# Patient Record
Sex: Female | Born: 1970 | Race: White | Hispanic: No | Marital: Married | Smoking: Never smoker
Health system: Southern US, Community
[De-identification: ages and names within clinical notes are randomized; demographics above are authoritative.]

## PROBLEM LIST (undated history)

## (undated) DIAGNOSIS — F419 Anxiety disorder, unspecified: Secondary | ICD-10-CM

## (undated) DIAGNOSIS — Z8742 Personal history of other diseases of the female genital tract: Secondary | ICD-10-CM

## (undated) DIAGNOSIS — N938 Other specified abnormal uterine and vaginal bleeding: Secondary | ICD-10-CM

## (undated) DIAGNOSIS — B372 Candidiasis of skin and nail: Secondary | ICD-10-CM

## (undated) DIAGNOSIS — F329 Major depressive disorder, single episode, unspecified: Secondary | ICD-10-CM

## (undated) DIAGNOSIS — Z87898 Personal history of other specified conditions: Secondary | ICD-10-CM

## (undated) DIAGNOSIS — D649 Anemia, unspecified: Secondary | ICD-10-CM

## (undated) DIAGNOSIS — Z8619 Personal history of other infectious and parasitic diseases: Secondary | ICD-10-CM

## (undated) DIAGNOSIS — F32A Depression, unspecified: Secondary | ICD-10-CM

## (undated) DIAGNOSIS — F53 Postpartum depression: Secondary | ICD-10-CM

## (undated) DIAGNOSIS — O99345 Other mental disorders complicating the puerperium: Secondary | ICD-10-CM

## (undated) HISTORY — DX: Candidiasis of skin and nail: B37.2

## (undated) HISTORY — DX: Personal history of other infectious and parasitic diseases: Z86.19

## (undated) HISTORY — DX: Depression, unspecified: F32.A

## (undated) HISTORY — PX: WISDOM TOOTH EXTRACTION: SHX21

## (undated) HISTORY — DX: Other specified abnormal uterine and vaginal bleeding: N93.8

## (undated) HISTORY — DX: Personal history of other diseases of the female genital tract: Z87.42

## (undated) HISTORY — DX: Major depressive disorder, single episode, unspecified: F32.9

## (undated) HISTORY — DX: Postpartum depression: F53.0

## (undated) HISTORY — DX: Other mental disorders complicating the puerperium: O99.345

## (undated) HISTORY — DX: Anemia, unspecified: D64.9

## (undated) HISTORY — PX: DILATION AND CURETTAGE OF UTERUS: SHX78

## (undated) HISTORY — DX: Anxiety disorder, unspecified: F41.9

## (undated) HISTORY — DX: Personal history of other specified conditions: Z87.898

---

## 2003-07-07 ENCOUNTER — Other Ambulatory Visit: Admission: RE | Admit: 2003-07-07 | Discharge: 2003-07-07 | Payer: Self-pay | Admitting: Obstetrics and Gynecology

## 2004-08-21 DIAGNOSIS — Z8742 Personal history of other diseases of the female genital tract: Secondary | ICD-10-CM

## 2004-08-21 HISTORY — DX: Personal history of other diseases of the female genital tract: Z87.42

## 2005-06-28 ENCOUNTER — Other Ambulatory Visit: Admission: RE | Admit: 2005-06-28 | Discharge: 2005-06-28 | Payer: Self-pay | Admitting: Obstetrics and Gynecology

## 2005-08-21 DIAGNOSIS — Z8742 Personal history of other diseases of the female genital tract: Secondary | ICD-10-CM

## 2005-08-21 DIAGNOSIS — D649 Anemia, unspecified: Secondary | ICD-10-CM

## 2005-08-21 DIAGNOSIS — B372 Candidiasis of skin and nail: Secondary | ICD-10-CM

## 2005-08-21 HISTORY — DX: Personal history of other diseases of the female genital tract: Z87.42

## 2005-08-21 HISTORY — DX: Anemia, unspecified: D64.9

## 2005-08-21 HISTORY — DX: Candidiasis of skin and nail: B37.2

## 2005-08-28 ENCOUNTER — Inpatient Hospital Stay (HOSPITAL_COMMUNITY): Admission: AD | Admit: 2005-08-28 | Discharge: 2005-08-28 | Payer: Self-pay | Admitting: Obstetrics and Gynecology

## 2006-04-20 ENCOUNTER — Inpatient Hospital Stay (HOSPITAL_COMMUNITY): Admission: AD | Admit: 2006-04-20 | Discharge: 2006-04-20 | Payer: Self-pay | Admitting: Obstetrics and Gynecology

## 2006-04-20 ENCOUNTER — Inpatient Hospital Stay (HOSPITAL_COMMUNITY): Admission: AD | Admit: 2006-04-20 | Discharge: 2006-04-23 | Payer: Self-pay | Admitting: Obstetrics and Gynecology

## 2006-04-21 ENCOUNTER — Encounter (INDEPENDENT_AMBULATORY_CARE_PROVIDER_SITE_OTHER): Payer: Self-pay | Admitting: *Deleted

## 2006-04-21 DIAGNOSIS — Z87898 Personal history of other specified conditions: Secondary | ICD-10-CM

## 2006-04-21 HISTORY — DX: Personal history of other specified conditions: Z87.898

## 2006-07-02 ENCOUNTER — Other Ambulatory Visit: Admission: RE | Admit: 2006-07-02 | Discharge: 2006-07-02 | Payer: Self-pay | Admitting: Obstetrics and Gynecology

## 2006-08-21 DIAGNOSIS — F419 Anxiety disorder, unspecified: Secondary | ICD-10-CM

## 2006-08-21 DIAGNOSIS — F53 Postpartum depression: Secondary | ICD-10-CM

## 2006-08-21 HISTORY — DX: Postpartum depression: F53.0

## 2006-08-21 HISTORY — DX: Anxiety disorder, unspecified: F41.9

## 2008-12-08 ENCOUNTER — Encounter: Admission: RE | Admit: 2008-12-08 | Discharge: 2008-12-08 | Payer: Self-pay | Admitting: Obstetrics and Gynecology

## 2009-08-21 HISTORY — PX: POLYPECTOMY: SHX149

## 2009-08-21 HISTORY — PX: HYSTEROSCOPY: SHX211

## 2010-06-30 ENCOUNTER — Ambulatory Visit (HOSPITAL_COMMUNITY): Admission: RE | Admit: 2010-06-30 | Discharge: 2010-06-30 | Payer: Self-pay | Admitting: Obstetrics and Gynecology

## 2010-11-01 LAB — CBC
HCT: 37.7 % (ref 36.0–46.0)
Hemoglobin: 12.5 g/dL (ref 12.0–15.0)
MCH: 28.7 pg (ref 26.0–34.0)
MCHC: 33.3 g/dL (ref 30.0–36.0)
MCV: 86.4 fL (ref 78.0–100.0)
Platelets: 311 10*3/uL (ref 150–400)
RBC: 4.36 MIL/uL (ref 3.87–5.11)
RDW: 12.3 % (ref 11.5–15.5)
WBC: 7.3 10*3/uL (ref 4.0–10.5)

## 2010-11-01 LAB — PREGNANCY, URINE: Preg Test, Ur: NEGATIVE

## 2011-01-06 NOTE — Discharge Summary (Signed)
NAMEARELENE, MORONI              ACCOUNT NO.:  [id number]   MEDICAL RECORD NO.:  [id number]          PATIENT TYPE:  INP   LOCATION:  9112                          F[facility]ILITY:  [facility]   PHYSICIAN:  Janine Limbo, M.D.DATE OF BIRTH:  07-Mar-1971   DATE OF ADMISSION:  04/20/2006  DATE OF DISCHARGE:  04/23/2006                                 DISCHARGE SUMMARY   ADMITTING DIAGNOSIS:  Intrauterine pregnancy at term, in labor.   PREOPERATIVE DIAGNOSES:  1. Intrauterine pregnancy at term.  2. Arrest of second stage of labor.  3. Maternal fatigue.   PROCEDURE:  1. Vacuum extractor vaginal delivery.  2. Repair of second-degree midline perineal laceration.   DISCHARGE DIAGNOSES:  1. Intrauterine pregnancy at term, delivered.  2. Arrest of second stage of labor.  3. Maternal fatigue.  4. Vacuum extractor.  5. Vaginal delivery.  6. Repair of second-degree midline perineal laceration.   HOSPITAL COURSE:  Ms. Klimas is a 40 year old gravida 1, para 0 who  presents at 39-5/7 weeks in early active labor.  Her labor progressed with  the use of Pitocin.  She became completely dilated and pushed well.  Dr.  Stefano Gaul was notified of arrest of second stage of labor and maternal  fatigue.  He came and discussed the options with the patient, and the  patient opted for vacuum extraction assistance.  This was accomplished on  April 21, 2006 by Dr. Marline Backbone.  Please see delivery note.  The  patient gave birth to a 7 pound, 4 ounce  female infant named Maisie Fus with  Apgar scores of 9 at one minute and 9 at five minutes.  Cord pH 7.27.  The  patient has done well in the postpartum period.  Her vital signs have  remained stable and she is afebrile.  Her hemoglobin on the first postpartum  day was 10.8, and on this, her second postpartum day, she is judged to be in  satisfactory condition for discharge.   DISCHARGE INSTRUCTIONS:  Per [facility] handout.   DISCHARGE MEDICATIONS:  1.  Motrin 600 mg p.o. q.6h. p.r.n. pain.  2. Tylox 1-2 p.o. q.3-4h. p.r.n. pain.  3. Prenatal vitamins.   DISCHARGE FOLLOW UP:  At [facility] in 6 weeks.      Rica Koyanagi, C.N.M.      Janine Limbo, M.D.  Electronically Signed    SDM/MEDQ  D:  04/23/2006  T:  04/23/2006  Job:  [id number]

## 2011-01-06 NOTE — H&P (Signed)
[name], [name]              ACCOUNT NO.:  [id number]   MEDICAL RECORD NO.:  [id number]          PATIENT TYPE:  INP   LOCATION:  9165                          [facility]ILITY:  [facility]   PHYSICIAN:  Crist Fat. Rivard, M.D. DATE OF BIRTH:  06/13/1971   DATE OF ADMISSION:  04/20/2006  DATE OF DISCHARGE:                                HISTORY & PHYSICAL   [name] is a 40 year old, gravida 1, para 2, who presents at 39-5/[redacted]  weeks gestation with complaints of regular uterine contractions with  increased intensity since 6 p.m., the day of admission.  The patient denies  rupture of membranes or bleeding.  The patient reports her fetus has been  moving normally.  The patient also complains of nausea and vomiting with her  uterine contractions and has been unable the keep down fluids and solids  today.  The patient reports increased intensity of contractions.  The  patient was previously evaluated by maternity admissions the day of  admission early a.m. and was discharged home after therapeutic rest.  The  patient received Ambien prior to discharge and did sleep throughout most of  the afternoon prior to the evening of the day of admission.  The patient  denies headaches, vision changes, right upper quadrant pain.  The patient  group B strep test is negative.  The patient states that she is having a  hard time dealing with contractions at the present time and requesting  relief from pain.  The patient's pregnancy remarkable for:  1. Increased body mass index.  2. Family history of muscular dystrophy.  3. History of depression.  4. History of migraine.  5. PENICILLIN allergy.   PRENATAL LABS:  Initial hemoglobin 12.4, hematocrit 35.4, platelets 256,000,  blood type O positive, antibody screen negative.  Toxoplasmosis IgG, IgM  negative x2.  RPR nonreactive.  Rubella titer equivocal.  Hepatitis B  surface antigen negative.  HIV nonreactive.  Pap smear within normal limits  November 2006.   Gonorrhea, Chlamydia cultures negative x2.  Cystic fibrosis  screening negative.  A 27-week hemoglobin 11.4.  One-hour Glucola 91.  RPR  at 27 weeks nonreactive.  Group B strep negative at 35 weeks.  Gonorrhea and  Chlamydia cultures declined at 35 weeks.   HISTORY OF PRESENT PREGNANCY:  The patient entered care at [redacted] weeks  gestation.  The patient's pregnancy has been followed by the certified nurse  midwife service at [facility].  The patient and her husband  declined genetic counseling secondary to family history of muscular  dystrophy.  The patient underwent free nuchal translucency screening  ultrasound at [redacted] weeks gestation with normal nuchal translucency at that  time.  The patient with nausea and vomiting in early pregnancy requiring  hospitalization for approximately 24 hours for IV hydration.  The patient  with use of Zofran in first and early second trimester for nausea and  vomiting.  Quadruple screen was declined.  The patient underwent ultrasound  at [redacted] weeks gestation which revealed size consistent with tight cervix, 3.88  cm, anterior placenta and normal anatomy except for bilateral fetal  renal  pyelectasis.  Ultrasound repeated at 22 weeks to follow up fetal renal  pyelectasis with fetal renal pyelectasis stable and normal amniotic fluid as  well as bladder appearing normally.  The patient underwent Glucola at 27  weeks.  The patient with repeat ultrasound done at 32 weeks for repeat  evaluation of fetal renal pyelectasis with fetal renal pyelectasis stable at  that point in time.  The remainder of the patient's pregnancy has been  unremarkable.  The patient's LMP was July 16, 2005 with [facility] of April 22, 2006 by dates, confirmed by ultrasound at six weeks and one day  gestation.   OB HISTORY:  Pregnancy #1 at present.   GYN HISTORY:  The patient reports regular menses every 28 to 30 days.  The  patient denies history of abnormal Pap smear or STDs.   The patient denies  any contraceptive use.   PAST MEDICAL HISTORY:  1. The patient with history of migraine headaches.  2. The patient with a history of depression in the past.  No medicine.      However, the patient has undergone counseling.  3. The patient with a history of occasional cystitis in the past.   FAMILY HISTORY:  Mother with COPD, borderline diabetes, hypothyroidism, and  depression.  Sister with migraines, depression.  Father with migraines.  Nicotine use.  Paternal grandmother with lung cancer.  Otherwise family  history negative.   GENETIC HISTORY:  The father of the baby's father with myotonia congenita.  The patient's second cousins with hemophilia.  Otherwise negative genetic  history.   CURRENT MEDICATIONS:  Prenatal vitamins.   ALLERGIES:  The patient is allergic to PENICILLIN and CEPHALOSPORINS.   SOCIAL HISTORY:  The patient is married to the father of the baby, Arlys John,  who is involved and supportive.  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  The father of the baby works as a [employer] fast streamer and has 12  years of education.  The patient denies use of alcohol, tobacco, and street  drugs.  The patient's domestic violence screen is negative.   PAST SURGICAL HISTORY:  Wisdom teeth at age 61.   REVIEW OF SYSTEMS:  Typical of __________  at term pregnancy.   PHYSICAL EXAMINATION:  VITAL SIGNS:  The patient is afebrile.  Vital signs  are stable.  HEENT:  Within normal limits.  LUNGS:  Clear.  HEART:  Regular rate and rhythm.  BREASTS:  Soft.  ABDOMEN:  Soft, gravid and nontender.  Fundal height extending 39 cm.  __________  symphysis pubis.  Fetus noted to be in longitudinal lie and  vertex to Leopold's maneuvers.  Fetal heart rate 140 at baseline with  variability as well as accelerations at present.  No decelerations are  present and fetal heart rate status is reassuring.  Uterine contractions are noted every five to seven minutes at  present and moderate to palpation.  CERVICAL:  Cervix to be 4-5 cm dilated, 90% effaced, minus 1 station with  bulging bag of water and small amount of bloody show noted.  EXTREMITIES:  With negative edema and negative Homan's bilaterally.   ASSESSMENT:  1. Intrauterine pregnancy at 39-5/7 weeks.  2. Early labor.   PLAN:  1. The patient will be admitted to birthing suite for consult with Dr.      Estanislado Pandy.  2. The patient plans epidural anesthesia during her labor of birth.  3. Routine CNM orders.  Will augment the Pitocin  p.r.n. to achieve      adequate labor pattern.      Rhona Leavens, CNM      Crist Fat Rivard, M.D.  Electronically Signed    NOS/MEDQ  D:  04/21/2006  T:  04/21/2006  Job:  [id number]

## 2011-01-06 NOTE — Op Note (Signed)
[name], [name] NO.:  [id number]   MEDICAL RECORD NO.:  [id number]          PATIENT TYPE:  INP   LOCATION:  9112                          [facility]ILITY:  [facility]   PHYSICIAN:  Janine Limbo, M.D.DATE OF BIRTH:  08-Aug-1971   DATE OF PROCEDURE:  04/21/2006  DATE OF DISCHARGE:                                 OPERATIVE REPORT   PREOPERATIVE DIAGNOSES:  1. Term intrauterine pregnancy.  2. Arrest at second stage of labor.  3. Maternal fatigue.   POSTOPERATIVE DIAGNOSES:  1. Term intrauterine pregnancy.  2. Arrest at second stage of labor.  3. Maternal fatigue.  4. Second-degree midline laceration.   PROCEDURE:  1. Vacuum extraction vaginal delivery.  2. Repair of second-degree midline laceration.   OBSTETRICIAN:  Leonard Schwartz, M.D.   FIRST ASSISTANT:  Philipp Deputy, C.N.M.   ANESTHETIC:  Epidural and 1% local Xylocaine.   DISPOSITION:  [name] is a 40 year old female, gravida 1, para 0, who  was admitted on April 20, 2006 at 39 weeks and 5 days' gestation.  She has  been followed at the [facility].  The patient was noted to be 4-5 cm on  admission.  The patient was followed through her labor by the nurse midwife  service.  She was started on Pitocin to augment her labor.  The patient was  noted to be completely dilated at approximately 6 o'clock a.m.  She was  allowed to push for some portion of the time, but then also allowed to rest  for some portion of her second stage of labor.  She did have variable  decelerations with pushing and she was placed in different positions to held  alleviate the decelerations.  She again began to push on a more consistent  basis, but became very tired from the pushing process.  The fetal head was  noted to be at a +3 station.  The cervix was completely effaced.  We  discussed our management options, which included continue pushing,  operative  vaginal delivery, and cesarean delivery.  The risk and benefits of those  options were reviewed.  The patient and the father the baby agreed to  proceed with operative vaginal delivery.  We discussed forceps vaginal  delivery and vacuum extraction vaginal delivery.  The risk and benefits of  those were reviewed.  The patient and the father the baby agreed to proceed  with vacuum extraction vaginal delivery.  The specific risks of vacuum  extraction vaginal delivery were then outlined including, but not limited  to, caput formation, hematoma formation, the rare risk of intracranial  bleeding, and the risk that the vacuum extraction would be unsuccessful and  that we would still need to proceed with cesarean delivery.  After all  questions were answered, they were ready to proceed.   FINDINGS:  The weight of the infant is currently not known.  The Apgar  scores for the baby were 9 at 1 minute and 9 at 5 minutes.  The arterial  cord blood pH was 7.27.  The infant was delivered from a slightly left  occiput anterior presentation.  There was noted to be a short umbilical  cord.  The umbilical cord did have 3 vessels.  The placental surfaces  appeared normal.  The placenta was sent to Pathology for evaluation.  The  patient was noted to have a second-degree midline laceration present after  the delivery.  There was a first-degree right labial laceration that was not  bleeding and did not require any stitches.  There were no upper vaginal or  cervical lacerations appreciated.  The postpartum uterus was noted to be  firm and bleeding was adequate.   PROCEDURE:  The patient was placed in a lithotomy position in the labor and  delivery suite.  The lower abdomen, perineum, and outer vagina were prepped  with soapy water solution.  The patient was then sterilely draped.  The  fetal head was noted to be a +3 station and you could see the fetal scalp  upon spreading the labia.  That Kiwi  vacuum extractor was applied.  The  infant was thought to be medium to small in size.  The head was thought to  be in an occiput-anterior presentation.  The patient was allowed to push.  Two pop-offs did occur.  We told the patient [employer] would suggest that  one should not apply the vacuum extractor after 2 pop-offs, but the patient  degrees to 1 additional try, because the fetal head was so close to being  delivered.  With 1 additional push, the patient was then able to deliver the  fetal head.  The mouth and nose were suctioned.  The remainder of the infant  was then delivered.  The cord was clamped and cut.  The infant was allowed  to rest on the mother's abdomen.  The pediatric team was present, but was  allowed to leave.  Routine cord blood studies were obtained.  The placenta  was removed.  The perineum was carefully inspected with findings as  mentioned above.  Ten milliliters of 1% Xylocaine were injected into the  midline.  The midline laceration was closed in a standard fashion using 3-0  Vicryl.  The upper vagina was carefully inspected and no lacerations were  present.  Hemostasis was adequate.  The uterus was noted to be firm.  The  infant was allowed to remain in the room with mother and father for bonding.  The placenta was sent to Pathology for evaluation.      Janine Limbo, M.D.  Electronically Signed     AVS/MEDQ  D:  04/21/2006  T:  04/23/2006  Job:  [id number]

## 2011-11-11 DIAGNOSIS — Z8742 Personal history of other diseases of the female genital tract: Secondary | ICD-10-CM

## 2011-11-11 HISTORY — DX: Personal history of other diseases of the female genital tract: Z87.42

## 2011-11-13 ENCOUNTER — Encounter (INDEPENDENT_AMBULATORY_CARE_PROVIDER_SITE_OTHER): Payer: BC Managed Care – PPO | Admitting: Obstetrics and Gynecology

## 2011-11-13 DIAGNOSIS — N921 Excessive and frequent menstruation with irregular cycle: Secondary | ICD-10-CM

## 2011-12-01 ENCOUNTER — Telehealth: Payer: Self-pay

## 2011-12-01 NOTE — Telephone Encounter (Signed)
Please pull chart.

## 2011-12-01 NOTE — Telephone Encounter (Signed)
Pt wants to sch novasure in office for a Thurs or Fri  ld

## 2011-12-04 ENCOUNTER — Encounter: Payer: Self-pay | Admitting: Internal Medicine

## 2011-12-04 ENCOUNTER — Ambulatory Visit (INDEPENDENT_AMBULATORY_CARE_PROVIDER_SITE_OTHER): Payer: Self-pay | Admitting: Internal Medicine

## 2011-12-04 VITALS — BP 112/66 | HR 59 | Temp 97.3°F | Ht 66.5 in | Wt 222.0 lb

## 2011-12-04 DIAGNOSIS — F32A Depression, unspecified: Secondary | ICD-10-CM | POA: Insufficient documentation

## 2011-12-04 DIAGNOSIS — G43909 Migraine, unspecified, not intractable, without status migrainosus: Secondary | ICD-10-CM | POA: Insufficient documentation

## 2011-12-04 DIAGNOSIS — R0683 Snoring: Secondary | ICD-10-CM | POA: Insufficient documentation

## 2011-12-04 DIAGNOSIS — F419 Anxiety disorder, unspecified: Secondary | ICD-10-CM | POA: Insufficient documentation

## 2011-12-04 DIAGNOSIS — R55 Syncope and collapse: Secondary | ICD-10-CM

## 2011-12-04 DIAGNOSIS — Z139 Encounter for screening, unspecified: Secondary | ICD-10-CM

## 2011-12-04 DIAGNOSIS — R0609 Other forms of dyspnea: Secondary | ICD-10-CM

## 2011-12-04 DIAGNOSIS — F329 Major depressive disorder, single episode, unspecified: Secondary | ICD-10-CM

## 2011-12-04 DIAGNOSIS — R5383 Other fatigue: Secondary | ICD-10-CM

## 2011-12-04 DIAGNOSIS — R5381 Other malaise: Secondary | ICD-10-CM

## 2011-12-04 LAB — POCT URINALYSIS DIPSTICK
Blood, UA: NEGATIVE
Glucose, UA: NEGATIVE
Ketones, UA: NEGATIVE
Leukocytes, UA: NEGATIVE
Nitrite, UA: NEGATIVE
Spec Grav, UA: 1.01
Urobilinogen, UA: 0.2
pH, UA: 7.5

## 2011-12-04 LAB — CBC WITH DIFFERENTIAL/PLATELET
Basophils Absolute: 0 10*3/uL (ref 0.0–0.1)
Basophils Relative: 0 % (ref 0–1)
Eosinophils Absolute: 0.1 10*3/uL (ref 0.0–0.7)
Eosinophils Relative: 2 % (ref 0–5)
HCT: 39.3 % (ref 36.0–46.0)
Hemoglobin: 12.4 g/dL (ref 12.0–15.0)
Lymphocytes Relative: 40 % (ref 12–46)
Lymphs Abs: 2.1 10*3/uL (ref 0.7–4.0)
MCH: 27.4 pg (ref 26.0–34.0)
MCHC: 31.6 g/dL (ref 30.0–36.0)
MCV: 86.8 fL (ref 78.0–100.0)
Monocytes Absolute: 0.3 10*3/uL (ref 0.1–1.0)
Monocytes Relative: 7 % (ref 3–12)
Neutro Abs: 2.6 10*3/uL (ref 1.7–7.7)
Neutrophils Relative %: 51 % (ref 43–77)
Platelets: 309 10*3/uL (ref 150–400)
RBC: 4.53 MIL/uL (ref 3.87–5.11)
RDW: 12.8 % (ref 11.5–15.5)
WBC: 5.1 10*3/uL (ref 4.0–10.5)

## 2011-12-04 LAB — COMPREHENSIVE METABOLIC PANEL
ALT: 15 U/L (ref 0–35)
AST: 20 U/L (ref 0–37)
Albumin: 4.3 g/dL (ref 3.5–5.2)
Alkaline Phosphatase: 57 U/L (ref 39–117)
BUN: 12 mg/dL (ref 6–23)
CO2: 26 mEq/L (ref 19–32)
Calcium: 9.2 mg/dL (ref 8.4–10.5)
Chloride: 106 mEq/L (ref 96–112)
Creat: 0.69 mg/dL (ref 0.50–1.10)
Glucose, Bld: 86 mg/dL (ref 70–99)
Potassium: 4.4 mEq/L (ref 3.5–5.3)
Sodium: 140 mEq/L (ref 135–145)
Total Bilirubin: 0.3 mg/dL (ref 0.3–1.2)
Total Protein: 6.8 g/dL (ref 6.0–8.3)

## 2011-12-04 LAB — T3, FREE: T3, Free: 2.9 pg/mL (ref 2.3–4.2)

## 2011-12-04 LAB — T4, FREE: Free T4: 1.22 ng/dL (ref 0.80–1.80)

## 2011-12-04 LAB — LIPID PANEL
Cholesterol: 172 mg/dL (ref 0–200)
HDL: 58 mg/dL (ref >39)
LDL Cholesterol: 100 mg/dL — ABNORMAL HIGH (ref 0–99)
Total CHOL/HDL Ratio: 3 Ratio
Triglycerides: 72 mg/dL (ref <150)
VLDL: 14 mg/dL (ref 0–40)

## 2011-12-04 LAB — TSH: TSH: 1.279 u[IU]/mL (ref 0.350–4.500)

## 2011-12-04 NOTE — Telephone Encounter (Signed)
Chart in SR office

## 2011-12-04 NOTE — Patient Instructions (Signed)
Labs will be mailed to you  Keep well hydrated especially in summer.    Will refer to Dr. Vickey Huger for sleep study  Schedule Complete physical with me

## 2011-12-04 NOTE — Progress Notes (Signed)
Here to establish care today. Reports had a vaso-vagal response at gynecologists office about 3 weeks ago.

## 2011-12-04 NOTE — Progress Notes (Signed)
  Subjective:    Patient ID: [name], female    DOB: 02-27-1971, 41 y.o.   MRN: [id number]  HPI  New patient here for first visit.  Former primary care at [facility] Warrick Parisian.  Past medical history of anxiety and depression, migraine headache, relieved by Excedrin Migraine. She has a therapist [employer].   Patient says her migraines are well controlled with Excedrin. She is concerned today because of several issues. She feels that she her thyroid may be under active. She has dry skin, she notices that her hair is falling out and it has been difficult to lose weight. She reports her thyroid blood work was tested several years ago.  She also tells me that her husband says that she has been snoring a lot. She also says that her husband reports that she wakes up and was gasping for breath while sleeping.  She is wondering if she has sleep apnea.  She also reports 3 weeks ago she was at her OB/GYN office and Dr. Estanislado Pandy and had a near syncopal episode. She felt dizzy like she was going to lose consciousness had to lie down, but she never lost complete consciousness. She denies palpitations chest pain or shortness of breath prior to episode. She she describes similar episodes on rare occasion she had one episode where she did lose consciousness she reports around 2000 or 2001. She also describes a near syncopal episode when she was about to get a shot of Novocain.  Review of Systems    See HPI Objective:   Physical Exam Physical Exam  Nursing note and vitals reviewed. Normal orthostatic vital signs my exam Constitutional: She is oriented to person, place, and time. She appears well-developed and well-nourished.  HENT:  Head: Normocephalic and atraumatic.  Cardiovascular: Normal rate and regular rhythm. Exam reveals no gallop and no friction rub.  No murmur heard.  Pulmonary/Chest: Breath sounds normal. She has no wheezes. She has no rales.  Neurological: She  is alert and oriented to person, place, and time.  Skin: Skin is warm and dry.  Psychiatric: She has a normal mood and affect. Her behavior is normal.        Assessment & Plan:  1)near syncope.  EKG today reveals sinus bradycardia at a rate of 57. Will go ahead and check baseline labs with thyroid studies. I advised her to drink lots of fluids and counseled her that if she has any complete loss of consciousness or increased frequency of symptoms she is to return to office. If problem becomes more frequent she may need Holter examination.  2) depression and anxiety. Depression dates back to her postpartum period and seems to be well controlled she has a therapist that she has a good relationship with.  3) fatigue and dry skin. Will check baseline labs and thyroid studies with CBC today   4) migraine headache seems to be well controlled on Excedrin Migraine  5) snoring. This will refer to Dr. Porfirio Mylar Dohmeier for sleep study and evaluation of possible sleep apnea.  Will get labs today and she is to schedule for a complete physical exam and to call if her near syncopal episodes become more frequent.

## 2011-12-05 LAB — VITAMIN D 25 HYDROXY (VIT D DEFICIENCY, FRACTURES): Vit D, 25-Hydroxy: 47 ng/mL (ref 30–89)

## 2011-12-06 ENCOUNTER — Telehealth: Payer: Self-pay | Admitting: *Deleted

## 2011-12-06 NOTE — Telephone Encounter (Signed)
Copy of labs from 12/04/11 mailed to patient.

## 2012-01-15 ENCOUNTER — Encounter: Payer: Self-pay | Admitting: Internal Medicine

## 2012-02-15 ENCOUNTER — Ambulatory Visit (INDEPENDENT_AMBULATORY_CARE_PROVIDER_SITE_OTHER): Payer: BC Managed Care – PPO | Admitting: Internal Medicine

## 2012-02-15 ENCOUNTER — Encounter: Payer: Self-pay | Admitting: Internal Medicine

## 2012-02-15 VITALS — BP 110/70 | HR 79 | Temp 98.0°F | Resp 16 | Ht 66.5 in | Wt 212.0 lb

## 2012-02-15 DIAGNOSIS — Z01419 Encounter for gynecological examination (general) (routine) without abnormal findings: Secondary | ICD-10-CM

## 2012-02-15 DIAGNOSIS — Z0000 Encounter for general adult medical examination without abnormal findings: Secondary | ICD-10-CM

## 2012-02-15 DIAGNOSIS — L989 Disorder of the skin and subcutaneous tissue, unspecified: Secondary | ICD-10-CM

## 2012-02-15 DIAGNOSIS — R55 Syncope and collapse: Secondary | ICD-10-CM

## 2012-02-15 DIAGNOSIS — F411 Generalized anxiety disorder: Secondary | ICD-10-CM

## 2012-02-15 DIAGNOSIS — F419 Anxiety disorder, unspecified: Secondary | ICD-10-CM

## 2012-02-15 LAB — POCT URINALYSIS DIPSTICK
Bilirubin, UA: NEGATIVE
Blood, UA: NEGATIVE
Glucose, UA: NEGATIVE
Ketones, UA: NEGATIVE
Leukocytes, UA: NEGATIVE
Nitrite, UA: NEGATIVE
Protein, UA: NEGATIVE
Spec Grav, UA: <=1.005
Urobilinogen, UA: NEGATIVE
pH, UA: 6

## 2012-02-15 MED ORDER — AZITHROMYCIN 250 MG PO TABS: ORAL_TABLET | ORAL | Status: AC

## 2012-02-15 NOTE — Patient Instructions (Addendum)
To call dermatologist for appt.

## 2012-02-15 NOTE — Progress Notes (Signed)
Subjective:    Patient ID: [name], female    DOB: 1971-05-11, 41 y.o.   MRN: [id number]  HPI [name] is here for comprehensive eval.  She is very happy as she has lost 10 lbs by dieting and exercise.    She has are near sternum that started as a pimple but doesn't seem to heal.  Area been present for several months Two weeks ago she injured her L great toe while attending a graduation.  Area had some skin fall off and still is tender medial side of nail bed  No further dizziness or near fainting feeelings  No recent migraine Headaches  Allergies  Allergen Reactions  . Penicillins   . Cephalosporins Rash   Past Medical History  Diagnosis Date  . Migraine   . Anxiety   . Depression    Past Surgical History  Procedure Date  . Hysteroscopy   . Dilation and curettage of uterus    History   Social History  . Marital Status: Married    Spouse Name: N/A    Number of Children: N/A  . Years of Education: N/A   Occupational History  . Not on file.   Social History Main Topics  . Smoking status: Never Smoker   . Smokeless tobacco: Never Used  . Alcohol Use:      occaional  . Drug Use: No  . Sexually Active: Yes    Birth Control/ Protection: Surgical     vasectomy   Other Topics Concern  . Not on file   Social History Narrative  . No narrative on file   Family History  Problem Relation Age of Onset  . Hypertension Mother   . Colon polyps Mother   . Other Mother     hypoglycemia/precancerous skin lesions  . Colon polyps Father   . Migraines Father   . Hyperlipidemia Father   . Cancer Paternal Grandmother   . Migraines Sister    Patient Active Problem List  Diagnosis  . Anxiety  . Depression  . Migraine  . Near syncope  . Snoring   Current Outpatient Prescriptions on File Prior to Visit  Medication Sig Dispense Refill  . buPROPion (WELLBUTRIN XL) 150 MG 24 hr tablet Take 450 mg by mouth daily.      Marland Kitchen desvenlafaxine (PRISTIQ) 50 MG 24 hr tablet  Take 50 mg by mouth daily.           Review of Systems  Constitutional: Negative.   HENT: Negative.   Eyes: Negative.   Respiratory: Negative.   Cardiovascular: Negative.   Gastrointestinal: Negative.   Genitourinary: Negative.   Musculoskeletal: Negative.   Skin: Positive for wound.  Neurological: Negative.   Hematological: Negative.   Psychiatric/Behavioral: Negative.        Objective:   Physical Exam Physical Exam  Nursing note and vitals reviewed.  Constitutional: She is oriented to person, place, and time. She appears well-developed and well-nourished.  HENT:  Head: Normocephalic and atraumatic.  Right Ear: Tympanic membrane and ear canal normal. No drainage. Tympanic membrane is not injected and not erythematous.  Left Ear: Tympanic membrane and ear canal normal. No drainage. Tympanic membrane is not injected and not erythematous.  Nose: Nose normal. Right sinus exhibits no maxillary sinus tenderness and no frontal sinus tenderness. Left sinus exhibits no maxillary sinus tenderness and no frontal sinus tenderness.  Mouth/Throat: Oropharynx is clear and moist. No oral lesions. No oropharyngeal exudate.  Eyes: Conjunctivae and EOM are normal. Pupils  are equal, round, and reactive to light.  Neck: Normal range of motion. Neck supple. No JVD present. Carotid bruit is not present. No mass and no thyromegaly present.  Cardiovascular: Normal rate, regular rhythm, S1 normal, S2 normal and intact distal pulses. Exam reveals no gallop and no friction rub.  No murmur heard.  Pulses:  Carotid pulses are 2+ on the right side, and 2+ on the left side.  Dorsalis pedis pulses are 2+ on the right side, and 2+ on the left side.  No carotid bruit. No LE edema  Pulmonary/Chest: Breath sounds normal. She has no wheezes. She has no rales. She exhibits no tenderness.  Abdominal: Soft. Bowel sounds are normal. She exhibits no distension and no mass. There is no hepatosplenomegaly. There is no  tenderness. There is no CVA tenderness.  Musculoskeletal: Normal range of motion.  No active synovitis to joints.  Lymphadenopathy:  She has no cervical adenopathy.  She has no axillary adenopathy.  Right: No inguinal and no supraclavicular adenopathy present.  Left: No inguinal and no supraclavicular adenopathy present.  Neurological: She is alert and oriented to person, place, and time. She has normal strength and normal reflexes. She displays no tremor. No cranial nerve deficit or sensory deficit. Coordination and gait normal.  Skin: Skin is warm and dry.. No cyanosis. Nails show no clubbing. She has what appers to be a tan colored fibroma or scar L side of sternum.   L great toe has a healing laceration near nail bed.  Minimal cellulitis surrounding laceration Psychiatric: She has a normal mood and affect. Her speech is normal and behavior is normal. Cognition and memory are normal.           Assessment & Plan:  History of near syncope  No further episodes   Skin lesions Advised pt to Call her dermatologist Dr. Amy Swaziland for evaluation and possible biopsy of chest lesion and to examine toenail  Toe wound  See above  Will give short course of z-pack.  If dermatology not able to evaluate toe I gave pt phone number to Jean Rosenthal podiatrist for pt to make appt.  She voices understanding  Anxiety/depression   Stable  May be a component  Of PMDD  History of migraine  Obesity

## 2012-02-16 ENCOUNTER — Encounter: Payer: Self-pay | Admitting: Obstetrics and Gynecology

## 2012-02-19 ENCOUNTER — Other Ambulatory Visit: Payer: Self-pay | Admitting: Obstetrics and Gynecology

## 2012-02-19 MED ORDER — DIAZEPAM 10 MG PO TABS: 10.0000 mg | ORAL_TABLET | Freq: Four times a day (QID) | ORAL | Status: AC | PRN

## 2012-02-19 MED ORDER — HYDROCODONE-ACETAMINOPHEN 5-500 MG PO TABS: 1.0000 | ORAL_TABLET | Freq: Four times a day (QID) | ORAL | Status: AC | PRN

## 2012-02-19 MED ORDER — IBUPROFEN 800 MG PO TABS: 800.0000 mg | ORAL_TABLET | Freq: Three times a day (TID) | ORAL | Status: AC | PRN

## 2012-02-19 MED ORDER — PROMETHAZINE HCL 25 MG PO TABS: 25.0000 mg | ORAL_TABLET | Freq: Four times a day (QID) | ORAL | Status: DC | PRN

## 2012-02-27 ENCOUNTER — Ambulatory Visit (INDEPENDENT_AMBULATORY_CARE_PROVIDER_SITE_OTHER): Payer: BC Managed Care – PPO | Admitting: Obstetrics and Gynecology

## 2012-02-27 ENCOUNTER — Encounter: Payer: Self-pay | Admitting: Obstetrics and Gynecology

## 2012-02-27 VITALS — BP 112/64 | HR 68 | Wt 213.0 lb

## 2012-02-27 DIAGNOSIS — N920 Excessive and frequent menstruation with regular cycle: Secondary | ICD-10-CM

## 2012-02-27 MED ORDER — PROMETHAZINE HCL 25 MG PO TABS: 25.0000 mg | ORAL_TABLET | Freq: Four times a day (QID) | ORAL | Status: DC | PRN

## 2012-02-27 NOTE — Progress Notes (Signed)
  Reason for admission:   Menorrhagia  History:     [name] is a 41 y.o. female, G1P1001, presenting today for pre-op visit of office hysteroscopy with Novasure ablation. Last seen in March 2013 c/o monthly cycle lasting up to 5 days with 3 heavy days. Pre-menstrual spotting and mid cycle spotting for the last 4 months.Clots up to 3 cm with moderate to severe dysmenorrhea requiring Ibuprofen 400 mg every 4 hours.June bleeding for 14 days.  Review of system:  Non-contributory   Past Medical History:   HSC/polypectomy 2011  Allergies:   PCN, cephalosporins  Social History:   Married, non-smoker, works as a Scientific laboratory technician  Family History:   non-contributory  Physical exam:    General Appearance: Alert, appropriate appearance for age. No acute distress Neck / Thyroid: Supple, no masses, nodes or enlargement Lungs: clear to auscultation bilaterally Back: No CVA tenderness. Cardiovascular: Regular rate and rhythm. S1, S2, no murmur Gastrointestinal: Soft, non-tender, no masses or organomegaly Pelvic Exam: VVC normal, uterus AV normal, normal adnexa   Assessment:   Menorrhagia with previous history of polypectomy   Plan:    Proceed with office hysteroscopy and Novasure endometrial ablation. Procedure, risks and benefits reviewed with patient including but not limited to bleeding, infection, injury to uterus. Patient voices understanding and is agreeable to proceed. Pre-procedure instructions reviewed with patient who is also given Cytotec 440 mcg per vagina the night before the procedure.

## 2012-02-27 NOTE — Progress Notes (Deleted)
Pt. Is here for pre-op today for novasure. Pt stated no issues today .

## 2012-02-29 ENCOUNTER — Telehealth: Payer: Self-pay | Admitting: Obstetrics and Gynecology

## 2012-02-29 NOTE — Telephone Encounter (Signed)
In Office Hysteroscopy/Ablation scheduled for 03/06/12 @ 12:00 with SR.  [employer]BS effective 06/22/11.  Plan pays 100% and may be subject to a $25 copayment per [facility] ref# [id number]. -Adrianne Pridgen

## 2012-03-01 SURGERY — Surgical Case
Anesthesia: *Unknown

## 2012-03-06 ENCOUNTER — Ambulatory Visit (INDEPENDENT_AMBULATORY_CARE_PROVIDER_SITE_OTHER): Payer: BC Managed Care – PPO | Admitting: Obstetrics and Gynecology

## 2012-03-06 ENCOUNTER — Encounter: Payer: Self-pay | Admitting: Obstetrics and Gynecology

## 2012-03-06 VITALS — BP 116/74 | HR 76 | Resp 18 | Ht 66.0 in | Wt 215.0 lb

## 2012-03-06 DIAGNOSIS — N921 Excessive and frequent menstruation with irregular cycle: Secondary | ICD-10-CM

## 2012-03-06 DIAGNOSIS — N920 Excessive and frequent menstruation with regular cycle: Secondary | ICD-10-CM

## 2012-03-06 LAB — POCT URINE PREGNANCY: Preg Test, Ur: NEGATIVE

## 2012-03-06 MED ORDER — KETOROLAC TROMETHAMINE 60 MG/2ML IM SOLN
60.0000 mg | Freq: Once | INTRAMUSCULAR | Status: AC
  Administered 2012-03-06: 60 mg via INTRAMUSCULAR

## 2012-03-06 MED ORDER — LEVOFLOXACIN 500 MG PO TABS: 500.0000 mg | ORAL_TABLET | Freq: Every day | ORAL | Status: AC

## 2012-03-06 NOTE — Progress Notes (Signed)
HYSTEROSCOPY WITH NOVASURE ENDOMETRIAL ABLATION PROCEDURE    Patient's Name: [name]  Date of Birth: 05-Jul-1971  Date of Procedure: 03/06/2012     Preoperative Diagnosis: metrorrhagia (no pattern)  Postoperative Diagnosis: metrorrhagia (no pattern) and same  Name of Operation/Procedure: Hysteroscopy, D&C with Novasure endometrial ablation  Estimated Hysteroscopic Fluid Deficit: 50  EBL: Minimal   [id number] prior to procedure   Procedure: The patient was placed in the dorsal lithotomy position.  She was sterilely prepped and draped.  A Grave's speculum was placed in the vaginal vault.  The anterior lip of the cervix was grasped with a single tooth tenaculum.  Using local anesthesia:Lidocaine 1% 15 cc with Marcaine 0.25 5cc, a paracervical block was placed in the sacrouterine pillars as we near the right and left parametrial areas.  Additionally, injection sites were placed circumferentially around the ectocervix: approximately 20 ml was used.  The internal cervical os is already dilated to #8 with previous use of Cytotec 200 mcg per vagina.  The Slimline Hysteroscope was introduced with NS as the distention media.  The findings were of a normal uterine cavity without EM polyp.   Sharp curettage WAS performed.  If sharp curettage was performed, the specimen was sent to pathology accordingly.  The uterus was sounded to 7.5 cm.  The uterine cavity length measured 4.5 cm and set on the Novasure handpiece.  The device was then inserted transcervically and the cavity width measured 3 cm as indicated by the cavity width dial on the Novasure handpiece.  The Cavity Integrity Assessment safety feature was initiated and passed.  The delivery of Radio Frequency energy was delivered for a total of 140 seconds of treatment time at a power of 74 watts.  The device was retracted and safely removed from the cavity. A post procedure hysteroscopy was performed to reveal a blanched uterine cavity.    The instruments were removed.  Sponge and needle counts were correct at completion of the procedure.  The patient was recovered and dismissed later.  She tolerated the procedure well.   Post-procedure note: patient doing well. Pain still well controlled. Normal vital signs. Post procedure instructions reviewed. Post-op visit in 3 weeks

## 2012-03-08 LAB — PATHOLOGY

## 2012-03-28 ENCOUNTER — Ambulatory Visit (INDEPENDENT_AMBULATORY_CARE_PROVIDER_SITE_OTHER): Payer: BC Managed Care – PPO | Admitting: Obstetrics and Gynecology

## 2012-03-28 ENCOUNTER — Encounter: Payer: Self-pay | Admitting: Obstetrics and Gynecology

## 2012-03-28 VITALS — BP 112/72 | Wt 214.0 lb

## 2012-03-28 DIAGNOSIS — N938 Other specified abnormal uterine and vaginal bleeding: Secondary | ICD-10-CM

## 2012-03-28 DIAGNOSIS — N949 Unspecified condition associated with female genital organs and menstrual cycle: Secondary | ICD-10-CM

## 2012-03-28 NOTE — Progress Notes (Signed)
Surgery: 03/07/2012  Date: Novasure   Eating a regular diet without difficulty. Bowel movements are normal.  Pain is controlled without any medications.  Bladder function is returned to normal. Vaginal bleeding: spotting Vaginal discharge: no vaginal discharge  Pt is here today for a 3 week p/o follow up    Subjective:     [name] is a 41 y.o. female who presents for post-op visit.No complaints. Mild spotting only.  Pathology report:    Endometrium - Biopsy:  Fragments of interval/early secretory endometrium. Negative for hyperplasia, atypia or malignancy.  was reviewed with patient.  The following portions of the patient's history were reviewed and updated as appropriate: allergies, current medications, past family history, past medical history, past social history, past surgical history and problem list.  Review of Systems Pertinent items are noted in HPI.   Objective:    BP 112/72  Wt 214 lb (97.07 kg)  LMP 03/19/2012 Weight:  Wt Readings from Last 1 Encounters:  03/28/12 214 lb (97.07 kg)    BMI: There is no height on file to calculate BMI.  General Appearance: Alert, appropriate appearance for age. No acute distress Pelvic Exam: Bimanual exam normal  Assessment:    Doing well postoperatively. Operative findings again reviewed.   Plan:   Follow-up at [facility]   [facility] A MD 8/8/20139:29 AM

## 2012-04-16 ENCOUNTER — Encounter: Payer: BC Managed Care – PPO | Admitting: Obstetrics and Gynecology

## 2012-04-17 ENCOUNTER — Encounter: Payer: BC Managed Care – PPO | Admitting: Obstetrics and Gynecology

## 2012-11-04 ENCOUNTER — Encounter: Payer: Self-pay | Admitting: Internal Medicine

## 2012-11-04 ENCOUNTER — Ambulatory Visit (INDEPENDENT_AMBULATORY_CARE_PROVIDER_SITE_OTHER): Payer: BC Managed Care – PPO | Admitting: Internal Medicine

## 2012-11-04 VITALS — BP 109/59 | HR 79 | Temp 97.8°F | Resp 18 | Ht 66.5 in | Wt 216.0 lb

## 2012-11-04 DIAGNOSIS — R3915 Urgency of urination: Secondary | ICD-10-CM

## 2012-11-04 DIAGNOSIS — R399 Unspecified symptoms and signs involving the genitourinary system: Secondary | ICD-10-CM

## 2012-11-04 DIAGNOSIS — R3989 Other symptoms and signs involving the genitourinary system: Secondary | ICD-10-CM

## 2012-11-04 DIAGNOSIS — H01139 Eczematous dermatitis of unspecified eye, unspecified eyelid: Secondary | ICD-10-CM

## 2012-11-04 LAB — POCT URINALYSIS DIPSTICK
Bilirubin, UA: NEGATIVE
Blood, UA: NEGATIVE
Glucose, UA: NEGATIVE
Ketones, UA: NEGATIVE
Leukocytes, UA: NEGATIVE
Nitrite, UA: NEGATIVE
Protein, UA: NEGATIVE
Spec Grav, UA: 1.015
Urobilinogen, UA: NEGATIVE
pH, UA: 5

## 2012-11-04 MED ORDER — HYDROCORTISONE 1 % EX OINT: TOPICAL_OINTMENT | CUTANEOUS | Status: DC

## 2012-11-04 MED ORDER — HYDROCORTISONE 2.5 % EX CREA: TOPICAL_CREAM | Freq: Two times a day (BID) | CUTANEOUS | Status: AC

## 2012-11-04 NOTE — Progress Notes (Signed)
Subjective:    Patient ID: [name], female    DOB: Jun 29, 1971, 42 y.o.   MRN: [id number]  HPI [name] is here for acute visit.   She is having several days of urinary urgency but no dysuria.      She also noted 3-4 weeks of very itchy red rash peri-orbital region.  No crusty drainage     No drainage on lashes  No blurred vision  Or visual change.  She is using coconut oil and aloe oil to remove eye makeup  Allergies  Allergen Reactions  . Penicillins   . Cephalosporins Rash   Past Medical History  Diagnosis Date  . Depression   . Migraine   . H/O varicella   . H/O dysmenorrhea 2006  . Yeast infection of the skin 2007    Breast  . Anemia 2007  . H/O amenorrhea 2007  . Postpartum depression 2008  . Anxiety 2008  . DUB (dysfunctional uterine bleeding)   . H/O metrorrhagia 11/11/11  . History of fatigue 04/21/2006   Past Surgical History  Procedure Laterality Date  . Hysteroscopy  2011  . Dilation and curettage of uterus    . Wisdom tooth extraction    . Polypectomy  2011   History   Social History  . Marital Status: Married    Spouse Name: N/A    Number of Children: N/A  . Years of Education: N/A   Occupational History  . Not on file.   Social History Main Topics  . Smoking status: Never Smoker   . Smokeless tobacco: Never Used  . Alcohol Use: Yes     Comment: occaional  . Drug Use: No  . Sexually Active: Yes    Birth Control/ Protection: Surgical, Coitus interruptus     Comment: vasectomy   Other Topics Concern  . Not on file   Social History Narrative  . No narrative on file   Family History  Problem Relation Age of Onset  . Hypertension Mother   . Colon polyps Mother   . Other Mother     hypoglycemia/precancerous skin lesions  . COPD Mother   . Diabetes Mother   . Thyroid disease Mother   . Depression Mother   . Colon polyps Father   . Migraines Father   . Hyperlipidemia Father   . Cancer Paternal Grandmother   . Migraines Sister   .  Depression Sister   . Arthritis Paternal Uncle   . Celiac disease Son    Patient Active Problem List  Diagnosis  . Anxiety  . Depression  . Migraine  . Near syncope  . Snoring  . Eczematous dermatitis of eyelid   Current Outpatient Prescriptions on File Prior to Visit  Medication Sig Dispense Refill  . buPROPion (WELLBUTRIN XL) 150 MG 24 hr tablet Take 450 mg by mouth daily.      . cholecalciferol (VITAMIN D) 1000 UNITS tablet Take 1,000 Units by mouth daily.      Marland Kitchen desvenlafaxine (PRISTIQ) 50 MG 24 hr tablet Take 50 mg by mouth daily.      . fish oil-omega-3 fatty acids 1000 MG capsule Take 2 g by mouth daily.      . Multiple Vitamin (MULTIVITAMIN) tablet Take 1 tablet by mouth daily.      Marland Kitchen b complex vitamins tablet Take 1 tablet by mouth daily.       No current facility-administered medications on file prior to visit.        Review  of Systems See HPI    Objective:   Physical Exam Physical Exam  Nursing note and vitals reviewed.  Constitutional: She is oriented to person, place, and time. She appears well-developed and well-nourished.  HENT:  Head: Normocephalic and atraumatic.  Eyes:  She has reddened scaly rash peri-orbital skin  No drainage on lashes Cardiovascular: Normal rate and regular rhythm. Exam reveals no gallop and no friction rub.  No murmur heard.  Pulmonary/Chest: Breath sounds normal. She has no wheezes. She has no rales.  Neurological: She is alert and oriented to person, place, and time.  Skin: Skin is warm and dry.   I see no other rash on back , chest or extremitites Psychiatric: She has a normal mood and affect. Her behavior is normal.             Assessment & Plan:  Peri-orbital dermatitis.    May be eczema.   Will give HC 2.5%  Bid for one week.  Advised to not use make-up or make up remover.    Will also check ANA  Urinary urgency  U/A normal.  She is to return if worsening symptoms

## 2012-11-04 NOTE — Patient Instructions (Addendum)
Pt to call her dermatologist    Use ointment for one week onlu

## 2012-11-05 LAB — ANA: Anti Nuclear Antibody(ANA): NEGATIVE

## 2012-11-14 ENCOUNTER — Encounter: Payer: Self-pay | Admitting: Internal Medicine

## 2012-11-14 ENCOUNTER — Ambulatory Visit (INDEPENDENT_AMBULATORY_CARE_PROVIDER_SITE_OTHER): Payer: BC Managed Care – PPO | Admitting: Internal Medicine

## 2012-11-14 VITALS — BP 113/76 | HR 82 | Temp 97.9°F | Resp 18

## 2012-11-14 DIAGNOSIS — Z87440 Personal history of urinary (tract) infections: Secondary | ICD-10-CM

## 2012-11-14 DIAGNOSIS — H01139 Eczematous dermatitis of unspecified eye, unspecified eyelid: Secondary | ICD-10-CM

## 2012-11-14 LAB — POCT URINALYSIS DIPSTICK
Bilirubin, UA: NEGATIVE
Blood, UA: NEGATIVE
Glucose, UA: NEGATIVE
Ketones, UA: NEGATIVE
Leukocytes, UA: NEGATIVE
Nitrite, UA: NEGATIVE
Protein, UA: NEGATIVE
Spec Grav, UA: 1.02
Urobilinogen, UA: NEGATIVE
pH, UA: 5.5

## 2012-11-14 NOTE — Progress Notes (Signed)
Subjective:    Patient ID: NIAJAH SIPOS, female    DOB: 1970/08/29, 42 y.o.   MRN: [id number]  HPI Sherill is here for follow up.  She had a few days of Cipro and all symptoms have resolved  She had seen the dermatologist for her eyes.  Eyes much better but now a few itchy synmptoms have returned  Allergies  Allergen Reactions  . Penicillins   . Cephalosporins Rash   Past Medical History  Diagnosis Date  . Depression   . Migraine   . H/O varicella   . H/O dysmenorrhea 2006  . Yeast infection of the skin 2007    Breast  . Anemia 2007  . H/O amenorrhea 2007  . Postpartum depression 2008  . Anxiety 2008  . DUB (dysfunctional uterine bleeding)   . H/O metrorrhagia 11/11/11  . History of fatigue 04/21/2006   Past Surgical History  Procedure Laterality Date  . Hysteroscopy  2011  . Dilation and curettage of uterus    . Wisdom tooth extraction    . Polypectomy  2011   History   Social History  . Marital Status: Married    Spouse Name: N/A    Number of Children: N/A  . Years of Education: N/A   Occupational History  . Not on file.   Social History Main Topics  . Smoking status: Never Smoker   . Smokeless tobacco: Never Used  . Alcohol Use: Yes     Comment: occaional  . Drug Use: No  . Sexually Active: Yes    Birth Control/ Protection: Surgical, Coitus interruptus     Comment: vasectomy   Other Topics Concern  . Not on file   Social History Narrative  . No narrative on file   Family History  Problem Relation Age of Onset  . Hypertension Mother   . Colon polyps Mother   . Other Mother     hypoglycemia/precancerous skin lesions  . COPD Mother   . Diabetes Mother   . Thyroid disease Mother   . Depression Mother   . Colon polyps Father   . Migraines Father   . Hyperlipidemia Father   . Cancer Paternal Grandmother   . Migraines Sister   . Depression Sister   . Arthritis Paternal Uncle   . Celiac disease Son    Patient Active Problem List   Diagnosis  . Anxiety  . Depression  . Migraine  . Near syncope  . Snoring  . Eczematous dermatitis of eyelid   Current Outpatient Prescriptions on File Prior to Visit  Medication Sig Dispense Refill  . b complex vitamins tablet Take 1 tablet by mouth daily.      Marland Kitchen buPROPion (WELLBUTRIN XL) 150 MG 24 hr tablet Take 450 mg by mouth daily.      . cholecalciferol (VITAMIN D) 1000 UNITS tablet Take 1,000 Units by mouth daily.      Marland Kitchen desvenlafaxine (PRISTIQ) 50 MG 24 hr tablet Take 50 mg by mouth daily.      . fish oil-omega-3 fatty acids 1000 MG capsule Take 2 g by mouth daily.      . hydrocortisone 2.5 % cream Apply topically 2 (two) times daily. Apply to rash bid for one week only  30 g  0  . Multiple Vitamin (MULTIVITAMIN) tablet Take 1 tablet by mouth daily.       No current facility-administered medications on file prior to visit.      Review of Systems See HPI  Objective:   Physical Exam Physical Exam  Nursing note and vitals reviewed.  Constitutional: She is oriented to person, place, and time. She appears well-developed and well-nourished.  HENT:  Head: Normocephalic and atraumatic.  Cardiovascular: Normal rate and regular rhythm. Exam reveals no gallop and no friction rub.  No murmur heard.  Pulmonary/Chest: Breath sounds normal. She has no wheezes. She has no rales.  Abd: No CVA tenderness  BS+  Neurological: She is alert and oriented to person, place, and time.  Skin: Skin is warm and dry.  Psychiatric: She has a normal mood and affect. Her behavior is normal.             Assessment & Plan:  Eye dermatitis.  Continue meds  Counseled to try preservative free  Contact solution  UTI

## 2012-11-14 NOTE — Patient Instructions (Addendum)
See me as needed 

## 2014-01-22 ENCOUNTER — Ambulatory Visit (INDEPENDENT_AMBULATORY_CARE_PROVIDER_SITE_OTHER): Payer: BC Managed Care – PPO | Admitting: Internal Medicine

## 2014-01-22 ENCOUNTER — Encounter: Payer: Self-pay | Admitting: Internal Medicine

## 2014-01-22 VITALS — BP 110/76 | HR 70 | Temp 98.1°F | Resp 18 | Ht 67.0 in | Wt 210.0 lb

## 2014-01-22 DIAGNOSIS — Z139 Encounter for screening, unspecified: Secondary | ICD-10-CM

## 2014-01-22 DIAGNOSIS — G43909 Migraine, unspecified, not intractable, without status migrainosus: Secondary | ICD-10-CM

## 2014-01-22 DIAGNOSIS — F419 Anxiety disorder, unspecified: Secondary | ICD-10-CM

## 2014-01-22 DIAGNOSIS — F32A Depression, unspecified: Secondary | ICD-10-CM

## 2014-01-22 DIAGNOSIS — F3289 Other specified depressive episodes: Secondary | ICD-10-CM

## 2014-01-22 DIAGNOSIS — F329 Major depressive disorder, single episode, unspecified: Secondary | ICD-10-CM

## 2014-01-22 DIAGNOSIS — F411 Generalized anxiety disorder: Secondary | ICD-10-CM

## 2014-01-22 LAB — CBC WITH DIFFERENTIAL/PLATELET
Basophils Absolute: 0 10*3/uL (ref 0.0–0.1)
Basophils Relative: 0 % (ref 0–1)
Eosinophils Absolute: 0.1 10*3/uL (ref 0.0–0.7)
Eosinophils Relative: 2 % (ref 0–5)
HCT: 38.6 % (ref 36.0–46.0)
Hemoglobin: 12.9 g/dL (ref 12.0–15.0)
Lymphocytes Relative: 37 % (ref 12–46)
Lymphs Abs: 2.3 10*3/uL (ref 0.7–4.0)
MCH: 28.1 pg (ref 26.0–34.0)
MCHC: 33.4 g/dL (ref 30.0–36.0)
MCV: 84.1 fL (ref 78.0–100.0)
Monocytes Absolute: 0.4 10*3/uL (ref 0.1–1.0)
Monocytes Relative: 7 % (ref 3–12)
Neutro Abs: 3.3 10*3/uL (ref 1.7–7.7)
Neutrophils Relative %: 54 % (ref 43–77)
Platelets: 302 10*3/uL (ref 150–400)
RBC: 4.59 MIL/uL (ref 3.87–5.11)
RDW: 13 % (ref 11.5–15.5)
WBC: 6.2 10*3/uL (ref 4.0–10.5)

## 2014-01-22 NOTE — Patient Instructions (Signed)
See me at CPE  To lab today

## 2014-01-22 NOTE — Progress Notes (Signed)
Subjective:    Patient ID: [name], female    DOB: 24-Jan-1971, 43 y.o.   MRN: [id number]  HPI [name] is here for acute visit.  She reports her psychiatric NP is retiring and she would like me to take over her Depression management  She has been very stable on Pristiq 50 mg  and  Welllbutrin  450 mg    Migraine very rare episode    Allergies  Allergen Reactions  . Penicillins   . Cephalosporins Rash   Past Medical History  Diagnosis Date  . Depression   . Migraine   . H/O varicella   . H/O dysmenorrhea 2006  . Yeast infection of the skin 2007    Breast  . Anemia 2007  . H/O amenorrhea 2007  . Postpartum depression 2008  . Anxiety 2008  . DUB (dysfunctional uterine bleeding)   . H/O metrorrhagia 11/11/11  . History of fatigue 04/21/2006   Past Surgical History  Procedure Laterality Date  . Hysteroscopy  2011  . Dilation and curettage of uterus    . Wisdom tooth extraction    . Polypectomy  2011   History   Social History  . Marital Status: Married    Spouse Name: N/A    Number of Children: N/A  . Years of Education: N/A   Occupational History  . Not on file.   Social History Main Topics  . Smoking status: Never Smoker   . Smokeless tobacco: Never Used  . Alcohol Use: Yes     Comment: occaional  . Drug Use: No  . Sexual Activity: Yes    Birth Control/ Protection: Surgical, Coitus interruptus     Comment: vasectomy   Other Topics Concern  . Not on file   Social History Narrative  . No narrative on file   Family History  Problem Relation Age of Onset  . Hypertension Mother   . Colon polyps Mother   . Other Mother     hypoglycemia/precancerous skin lesions  . COPD Mother   . Diabetes Mother   . Thyroid disease Mother   . Depression Mother   . Colon polyps Father   . Migraines Father   . Hyperlipidemia Father   . Cancer Paternal Grandmother   . Migraines Sister   . Depression Sister   . Arthritis Paternal Uncle   . Celiac disease Son     Patient Active Problem List   Diagnosis Date Noted  . Eczematous dermatitis of eyelid 11/04/2012  . Near syncope 12/04/2011  . Snoring 12/04/2011  . Anxiety   . Depression   . Migraine    Current Outpatient Prescriptions on File Prior to Visit  Medication Sig Dispense Refill  . buPROPion (WELLBUTRIN XL) 150 MG 24 hr tablet Take 450 mg by mouth daily.      . cholecalciferol (VITAMIN D) 1000 UNITS tablet Take 1,000 Units by mouth daily.      Marland Kitchen desvenlafaxine (PRISTIQ) 50 MG 24 hr tablet Take 50 mg by mouth daily.      . fish oil-omega-3 fatty acids 1000 MG capsule Take 2 g by mouth daily.      Marland Kitchen b complex vitamins tablet Take 1 tablet by mouth daily.      . hydrocortisone 2.5 % cream Apply topically 2 (two) times daily. Apply to rash bid for one week only  30 g  0  . Multiple Vitamin (MULTIVITAMIN) tablet Take 1 tablet by mouth daily.  No current facility-administered medications on file prior to visit.       Review of Systems See HPI    Objective:   Physical Exam  Physical Exam  Nursing note and vitals reviewed.  Constitutional: She is oriented to person, place, and time. She appears well-developed and well-nourished.  HENT:  Head: Normocephalic and atraumatic.  Cardiovascular: Normal rate and regular rhythm. Exam reveals no gallop and no friction rub.  No murmur heard.  Pulmonary/Chest: Breath sounds normal. She has no wheezes. She has no rales.  Neurological: She is alert and oriented to person, place, and time.  Skin: Skin is warm and dry.  Psychiatric: She has a normal mood and affect. Her behavior is normal.        Assessment & Plan:  Depression  Ok to continue Pristiq and wellbutrin    Will get all labs today  Anxiety  See above  Migriane    Keep CPe appt with me

## 2014-01-23 LAB — COMPREHENSIVE METABOLIC PANEL
ALT: 21 U/L (ref 0–35)
AST: 15 U/L (ref 0–37)
Albumin: 4.2 g/dL (ref 3.5–5.2)
Alkaline Phosphatase: 70 U/L (ref 39–117)
BUN: 14 mg/dL (ref 6–23)
CO2: 26 mEq/L (ref 19–32)
Calcium: 9.8 mg/dL (ref 8.4–10.5)
Chloride: 105 mEq/L (ref 96–112)
Creat: 0.7 mg/dL (ref 0.50–1.10)
Glucose, Bld: 83 mg/dL (ref 70–99)
Potassium: 4.4 mEq/L (ref 3.5–5.3)
Sodium: 139 mEq/L (ref 135–145)
Total Bilirubin: 0.4 mg/dL (ref 0.2–1.2)
Total Protein: 6.9 g/dL (ref 6.0–8.3)

## 2014-01-23 LAB — LIPID PANEL
Cholesterol: 172 mg/dL (ref 0–200)
HDL: 56 mg/dL (ref >39)
LDL Cholesterol: 96 mg/dL (ref 0–99)
Total CHOL/HDL Ratio: 3.1 Ratio
Triglycerides: 100 mg/dL (ref <150)
VLDL: 20 mg/dL (ref 0–40)

## 2014-01-23 LAB — VITAMIN D 25 HYDROXY (VIT D DEFICIENCY, FRACTURES): Vit D, 25-Hydroxy: 61 ng/mL (ref 30–89)

## 2014-01-23 LAB — TSH: TSH: 1.034 u[IU]/mL (ref 0.350–4.500)

## 2014-01-27 ENCOUNTER — Encounter: Payer: Self-pay | Admitting: *Deleted

## 2014-03-10 ENCOUNTER — Other Ambulatory Visit: Payer: Self-pay | Admitting: Internal Medicine

## 2014-03-10 NOTE — Telephone Encounter (Signed)
Requested Medications     Medication name:  Name from pharmacy:  WELLBUTRIN XL 150 MG 24 hr tablet  WELLBUTRIN XL 150 MG TABLET    Sig: TAKE 3 TABLETS BY MOUTH IN THE MORNING    Dispense: 90 tablet Refills: 2 DAW Start: 03/10/2014  Class: Normal    Notes to pharmacy: PREVIOUS PCP RX'D DAW 1 WELLBUTRIN XL    Requested on: 12/08/2013    Originally ordered on: 12/04/2011 Last refill: 02/07/2014 Order History and Details

## 2014-06-07 ENCOUNTER — Other Ambulatory Visit: Payer: Self-pay | Admitting: Internal Medicine

## 2014-06-10 ENCOUNTER — Other Ambulatory Visit: Payer: Self-pay | Admitting: Internal Medicine

## 2014-06-10 NOTE — Telephone Encounter (Signed)
Refill request

## 2014-06-11 ENCOUNTER — Other Ambulatory Visit: Payer: Self-pay | Admitting: Internal Medicine

## 2014-06-11 ENCOUNTER — Other Ambulatory Visit: Payer: Self-pay | Admitting: *Deleted

## 2014-06-11 NOTE — Telephone Encounter (Signed)
Refill request

## 2014-06-12 NOTE — Telephone Encounter (Signed)
Have pharmacy fax the most current RX to you  I am taking over this RX as her psychiatric meds as previous provider is retiring  The dose is confusing is it 150 mg XL or is it 450 mg XL?    You may need to validate with pt as I do not have her psychiatric records

## 2014-06-12 NOTE — Telephone Encounter (Signed)
I spoke with Morrie SheldonAshley and she verified that her current dose is Wellbutrin 150mg  LX TID. I am still waiting on fax from pharmacy to verify the last dose filled there.-eh

## 2014-06-22 ENCOUNTER — Encounter: Payer: Self-pay | Admitting: Internal Medicine

## 2014-06-24 ENCOUNTER — Encounter: Payer: Self-pay | Admitting: Internal Medicine

## 2014-06-24 ENCOUNTER — Other Ambulatory Visit: Payer: Self-pay | Admitting: Internal Medicine

## 2014-06-24 ENCOUNTER — Ambulatory Visit (INDEPENDENT_AMBULATORY_CARE_PROVIDER_SITE_OTHER): Payer: 59 | Admitting: Internal Medicine

## 2014-06-24 VITALS — BP 117/73 | HR 87 | Resp 16 | Ht 66.25 in | Wt 210.0 lb

## 2014-06-24 DIAGNOSIS — Z1231 Encounter for screening mammogram for malignant neoplasm of breast: Secondary | ICD-10-CM

## 2014-06-24 DIAGNOSIS — G43809 Other migraine, not intractable, without status migrainosus: Secondary | ICD-10-CM

## 2014-06-24 DIAGNOSIS — Z0000 Encounter for general adult medical examination without abnormal findings: Secondary | ICD-10-CM

## 2014-06-24 DIAGNOSIS — F329 Major depressive disorder, single episode, unspecified: Secondary | ICD-10-CM

## 2014-06-24 DIAGNOSIS — Z0001 Encounter for general adult medical examination with abnormal findings: Secondary | ICD-10-CM

## 2014-06-24 DIAGNOSIS — F32A Depression, unspecified: Secondary | ICD-10-CM

## 2014-06-24 DIAGNOSIS — F419 Anxiety disorder, unspecified: Secondary | ICD-10-CM

## 2014-06-24 DIAGNOSIS — Z23 Encounter for immunization: Secondary | ICD-10-CM

## 2014-06-24 LAB — POCT URINALYSIS DIPSTICK
Bilirubin, UA: NEGATIVE
Blood, UA: NEGATIVE
Glucose, UA: NEGATIVE
Leukocytes, UA: NEGATIVE
Nitrite, UA: NEGATIVE
Spec Grav, UA: 1.025
Urobilinogen, UA: NEGATIVE
pH, UA: 8.5

## 2014-06-24 NOTE — Patient Instructions (Signed)
[facility]   Patient to call GYN for pap smear appointment   See me as needed

## 2014-06-24 NOTE — Progress Notes (Signed)
Subjective:    Patient ID: [name], female    DOB: 11/02/1970, 43 y.o.   MRN: [id number]  HPI  Depression Ok to continue Pristiq and wellbutrin Will get all labs today  Anxiety See above  Migriane   Keep CPe appt with me     Today  [name] is here for CPE   HM needs first screening mm,   Last pap Dr. Estanislado Pandyivard,  Pt is a non-smoker  prbolem list and meds reviewied    Allergies  Allergen Reactions  . Penicillins   . Cephalosporins Rash   Past Medical History  Diagnosis Date  . Depression   . Migraine   . H/O varicella   . H/O dysmenorrhea 2006  . Yeast infection of the skin 2007    Breast  . Anemia 2007  . H/O amenorrhea 2007  . Postpartum depression 2008  . Anxiety 2008  . DUB (dysfunctional uterine bleeding)   . H/O metrorrhagia 11/11/11  . History of fatigue 04/21/2006   Past Surgical History  Procedure Laterality Date  . Hysteroscopy  2011  . Dilation and curettage of uterus    . Wisdom tooth extraction    . Polypectomy  2011   History   Social History  . Marital Status: Married    Spouse Name: N/A    Number of Children: N/A  . Years of Education: N/A   Occupational History  . Not on file.   Social History Main Topics  . Smoking status: Never Smoker   . Smokeless tobacco: Never Used  . Alcohol Use: Yes     Comment: occaional  . Drug Use: No  . Sexual Activity: Yes    Birth Control/ Protection: Surgical, Coitus interruptus     Comment: vasectomy   Other Topics Concern  . Not on file   Social History Narrative   Family History  Problem Relation Age of Onset  . Hypertension Mother   . Colon polyps Mother   . Other Mother     hypoglycemia/precancerous skin lesions  . COPD Mother   . Diabetes Mother   . Thyroid disease Mother   . Depression Mother   . Colon polyps Father   . Migraines Father   . Hyperlipidemia Father   . Cancer Paternal Grandmother   . Migraines Sister   . Depression Sister   . Arthritis Paternal  Uncle   . Celiac disease Son    Patient Active Problem List   Diagnosis Date Noted  . Eczematous dermatitis of eyelid 11/04/2012  . Near syncope 12/04/2011  . Snoring 12/04/2011  . Anxiety   . Depression   . Migraine    Current Outpatient Prescriptions on File Prior to Visit  Medication Sig Dispense Refill  . b complex vitamins tablet Take 1 tablet by mouth daily.    . cetirizine (ZYRTEC) 10 MG tablet Take 10 mg by mouth daily.    . cholecalciferol (VITAMIN D) 1000 UNITS tablet Take 1,000 Units by mouth daily.    . fish oil-omega-3 fatty acids 1000 MG capsule Take 2 g by mouth daily.    . hydrocortisone 2.5 % cream Apply topically 2 (two) times daily. Apply to rash bid for one week only 30 g 0  . Multiple Vitamin (MULTIVITAMIN) tablet Take 1 tablet by mouth daily.    Marland Kitchen. PRISTIQ 50 MG 24 hr tablet TAKE 1 TABLET BY MOUTH IN THE MORNING -- NEEDS APPT FOR ANY MORE REFILLS 30 tablet 3  . WELLBUTRIN XL  150 MG 24 hr tablet TAKE 3 TABLETS BY MOUTH IN THE MORNING 90 tablet 0   No current facility-administered medications on file prior to visit.      Review of Systems    see HPI Objective:   Physical Exam  . Physical Exam  Nursing note and vitals reviewed.  Constitutional: She is oriented to person, place, and time. She appears well-developed and well-nourished.  HENT:  Head: Normocephalic and atraumatic.  Right Ear: Tympanic membrane and ear canal normal. No drainage. Tympanic membrane is not injected and not erythematous.  Left Ear: Tympanic membrane and ear canal normal. No drainage. Tympanic membrane is not injected and not erythematous.  Nose: Nose normal. Right sinus exhibits no maxillary sinus tenderness and no frontal sinus tenderness. Left sinus exhibits no maxillary sinus tenderness and no frontal sinus tenderness.  Mouth/Throat: Oropharynx is clear and moist. No oral lesions. No oropharyngeal exudate.  Eyes: Conjunctivae and EOM are normal. Pupils are equal, round, and  reactive to light.  Neck: Normal range of motion. Neck supple. No JVD present. Carotid bruit is not present. No mass and no thyromegaly present.  Cardiovascular: Normal rate, regular rhythm, S1 normal, S2 normal and intact distal pulses. Exam reveals no gallop and no friction rub.  No murmur heard.  Pulses:  Carotid pulses are 2+ on the right side, and 2+ on the left side.  Dorsalis pedis pulses are 2+ on the right side, and 2+ on the left side.  No carotid bruit. No LE edema  Pulmonary/Chest: Breath sounds normal. She has no wheezes. She has no rales. She exhibits no tenderness.  Breast no discrete mass no nipple discharge no axillary adenopathy bilaterally  Abdominal: Soft. Bowel sounds are normal. She exhibits no distension and no mass. There is no hepatosplenomegaly. There is no tenderness. There is no CVA tenderness.  Musculoskeletal: Normal range of motion.  No active synovitis to joints.  Lymphadenopathy:  She has no cervical adenopathy.  She has no axillary adenopathy.  Right: No inguinal and no supraclavicular adenopathy present.  Left: No inguinal and no supraclavicular adenopathy present.  Neurological: She is alert and oriented to person, place, and time. She has normal strength and normal reflexes. She displays no tremor. No cranial nerve deficit or sensory deficit. Coordination and gait normal.  Skin: Skin is warm and dry. No rash noted. No cyanosis. Nails show no clubbing.  Psychiatric: She has a normal mood and affect. Her speech is normal and behavior is normal. Cognition and memory are normal.               Assessment & Plan:  HM: will schedule 3D mm  Pt advised ot see GYN for pap   Counseled lung CA guideline pt a non-smoker  Mixed anxiety /depression  Continue Pristiq nad wellbutrin  Headache   otc prn    Flu vaccine today

## 2014-06-25 ENCOUNTER — Encounter: Payer: Self-pay | Admitting: *Deleted

## 2014-07-08 ENCOUNTER — Other Ambulatory Visit: Payer: Self-pay | Admitting: Internal Medicine

## 2014-07-08 NOTE — Telephone Encounter (Signed)
Refill request

## 2014-07-13 ENCOUNTER — Ambulatory Visit: Admission: RE | Admit: 2014-07-13 | Payer: 59 | Source: Ambulatory Visit

## 2014-07-13 ENCOUNTER — Ambulatory Visit
Admission: RE | Admit: 2014-07-13 | Discharge: 2014-07-13 | Disposition: A | Discharge status: Home / Self Care | Payer: 59 | Source: Ambulatory Visit | Attending: Internal Medicine | Admitting: Internal Medicine

## 2014-07-13 ENCOUNTER — Other Ambulatory Visit: Payer: Self-pay | Admitting: Internal Medicine

## 2014-07-13 DIAGNOSIS — Z1231 Encounter for screening mammogram for malignant neoplasm of breast: Secondary | ICD-10-CM

## 2014-10-05 ENCOUNTER — Other Ambulatory Visit: Payer: Self-pay | Admitting: Internal Medicine

## 2014-10-06 NOTE — Telephone Encounter (Signed)
Refill request

## 2014-11-01 ENCOUNTER — Other Ambulatory Visit: Payer: Self-pay | Admitting: Internal Medicine

## 2014-11-02 NOTE — Telephone Encounter (Signed)
Refill request

## 2014-11-19 ENCOUNTER — Encounter: Payer: Self-pay | Admitting: Internal Medicine

## 2014-12-21 ENCOUNTER — Encounter: Payer: Self-pay | Admitting: *Deleted

## 2016-10-16 ENCOUNTER — Other Ambulatory Visit: Payer: Self-pay | Admitting: Internal Medicine

## 2016-10-16 ENCOUNTER — Ambulatory Visit
Admission: RE | Admit: 2016-10-16 | Discharge: 2016-10-16 | Disposition: A | Discharge status: Home / Self Care | Payer: 59 | Source: Ambulatory Visit | Attending: Internal Medicine | Admitting: Internal Medicine

## 2016-10-16 DIAGNOSIS — S59902A Unspecified injury of left elbow, initial encounter: Secondary | ICD-10-CM

## 2018-06-28 DIAGNOSIS — F4323 Adjustment disorder with mixed anxiety and depressed mood: Secondary | ICD-10-CM | POA: Diagnosis not present

## 2018-07-10 DIAGNOSIS — F4323 Adjustment disorder with mixed anxiety and depressed mood: Secondary | ICD-10-CM | POA: Diagnosis not present

## 2018-07-23 DIAGNOSIS — F4323 Adjustment disorder with mixed anxiety and depressed mood: Secondary | ICD-10-CM | POA: Diagnosis not present

## 2018-08-06 DIAGNOSIS — F4323 Adjustment disorder with mixed anxiety and depressed mood: Secondary | ICD-10-CM | POA: Diagnosis not present

## 2018-08-22 DIAGNOSIS — F4323 Adjustment disorder with mixed anxiety and depressed mood: Secondary | ICD-10-CM | POA: Diagnosis not present

## 2018-10-10 DIAGNOSIS — F4323 Adjustment disorder with mixed anxiety and depressed mood: Secondary | ICD-10-CM | POA: Diagnosis not present

## 2018-10-24 DIAGNOSIS — F4323 Adjustment disorder with mixed anxiety and depressed mood: Secondary | ICD-10-CM | POA: Diagnosis not present

## 2018-11-07 DIAGNOSIS — F4323 Adjustment disorder with mixed anxiety and depressed mood: Secondary | ICD-10-CM | POA: Diagnosis not present

## 2018-11-21 DIAGNOSIS — F4323 Adjustment disorder with mixed anxiety and depressed mood: Secondary | ICD-10-CM | POA: Diagnosis not present

## 2018-12-05 DIAGNOSIS — F4323 Adjustment disorder with mixed anxiety and depressed mood: Secondary | ICD-10-CM | POA: Diagnosis not present

## 2019-01-07 DIAGNOSIS — F4323 Adjustment disorder with mixed anxiety and depressed mood: Secondary | ICD-10-CM | POA: Diagnosis not present

## 2019-01-22 DIAGNOSIS — F325 Major depressive disorder, single episode, in full remission: Secondary | ICD-10-CM | POA: Diagnosis not present

## 2019-01-22 DIAGNOSIS — F419 Anxiety disorder, unspecified: Secondary | ICD-10-CM | POA: Diagnosis not present

## 2019-01-22 DIAGNOSIS — N912 Amenorrhea, unspecified: Secondary | ICD-10-CM | POA: Diagnosis not present

## 2019-02-03 DIAGNOSIS — Z3040 Encounter for surveillance of contraceptives, unspecified: Secondary | ICD-10-CM | POA: Diagnosis not present

## 2019-02-03 DIAGNOSIS — Z6833 Body mass index (BMI) 33.0-33.9, adult: Secondary | ICD-10-CM | POA: Diagnosis not present

## 2019-02-03 DIAGNOSIS — Z01419 Encounter for gynecological examination (general) (routine) without abnormal findings: Secondary | ICD-10-CM | POA: Diagnosis not present

## 2019-02-03 DIAGNOSIS — Z1231 Encounter for screening mammogram for malignant neoplasm of breast: Secondary | ICD-10-CM | POA: Diagnosis not present

## 2019-02-26 DIAGNOSIS — N951 Menopausal and female climacteric states: Secondary | ICD-10-CM | POA: Diagnosis not present

## 2019-03-17 DIAGNOSIS — F419 Anxiety disorder, unspecified: Secondary | ICD-10-CM | POA: Diagnosis not present

## 2019-03-17 DIAGNOSIS — E785 Hyperlipidemia, unspecified: Secondary | ICD-10-CM | POA: Diagnosis not present

## 2019-03-17 DIAGNOSIS — F325 Major depressive disorder, single episode, in full remission: Secondary | ICD-10-CM | POA: Diagnosis not present

## 2019-03-17 DIAGNOSIS — Z0000 Encounter for general adult medical examination without abnormal findings: Secondary | ICD-10-CM | POA: Diagnosis not present

## 2019-03-17 DIAGNOSIS — N951 Menopausal and female climacteric states: Secondary | ICD-10-CM | POA: Diagnosis not present

## 2019-04-07 DIAGNOSIS — L820 Inflamed seborrheic keratosis: Secondary | ICD-10-CM | POA: Diagnosis not present

## 2019-06-09 DIAGNOSIS — L281 Prurigo nodularis: Secondary | ICD-10-CM | POA: Diagnosis not present

## 2019-06-09 DIAGNOSIS — L723 Sebaceous cyst: Secondary | ICD-10-CM | POA: Diagnosis not present

## 2019-06-18 ENCOUNTER — Other Ambulatory Visit: Payer: Self-pay

## 2019-06-18 DIAGNOSIS — Z20822 Contact with and (suspected) exposure to covid-19: Secondary | ICD-10-CM

## 2019-06-19 LAB — NOVEL CORONAVIRUS, NAA: SARS-CoV-2, NAA: NOT DETECTED

## 2019-07-23 DIAGNOSIS — J029 Acute pharyngitis, unspecified: Secondary | ICD-10-CM | POA: Diagnosis not present

## 2019-08-21 DIAGNOSIS — F325 Major depressive disorder, single episode, in full remission: Secondary | ICD-10-CM | POA: Diagnosis not present

## 2019-08-21 DIAGNOSIS — F419 Anxiety disorder, unspecified: Secondary | ICD-10-CM | POA: Diagnosis not present

## 2019-09-15 DIAGNOSIS — L738 Other specified follicular disorders: Secondary | ICD-10-CM | POA: Diagnosis not present

## 2019-09-15 DIAGNOSIS — L281 Prurigo nodularis: Secondary | ICD-10-CM | POA: Diagnosis not present

## 2020-02-04 DIAGNOSIS — Z01419 Encounter for gynecological examination (general) (routine) without abnormal findings: Secondary | ICD-10-CM | POA: Diagnosis not present

## 2020-02-04 DIAGNOSIS — Z6841 Body Mass Index (BMI) 40.0 and over, adult: Secondary | ICD-10-CM | POA: Diagnosis not present

## 2020-02-04 DIAGNOSIS — Z1211 Encounter for screening for malignant neoplasm of colon: Secondary | ICD-10-CM | POA: Diagnosis not present

## 2020-02-04 DIAGNOSIS — Z1231 Encounter for screening mammogram for malignant neoplasm of breast: Secondary | ICD-10-CM | POA: Diagnosis not present

## 2020-02-19 DIAGNOSIS — Z8371 Family history of colonic polyps: Secondary | ICD-10-CM | POA: Diagnosis not present

## 2020-02-19 DIAGNOSIS — Z1211 Encounter for screening for malignant neoplasm of colon: Secondary | ICD-10-CM | POA: Diagnosis not present

## 2020-02-19 DIAGNOSIS — K5900 Constipation, unspecified: Secondary | ICD-10-CM | POA: Diagnosis not present

## 2020-02-19 DIAGNOSIS — K219 Gastro-esophageal reflux disease without esophagitis: Secondary | ICD-10-CM | POA: Diagnosis not present

## 2020-03-19 DIAGNOSIS — Z13220 Encounter for screening for lipoid disorders: Secondary | ICD-10-CM | POA: Diagnosis not present

## 2020-03-19 DIAGNOSIS — Z0000 Encounter for general adult medical examination without abnormal findings: Secondary | ICD-10-CM | POA: Diagnosis not present

## 2020-04-07 DIAGNOSIS — J309 Allergic rhinitis, unspecified: Secondary | ICD-10-CM | POA: Diagnosis not present

## 2020-04-07 DIAGNOSIS — H66001 Acute suppurative otitis media without spontaneous rupture of ear drum, right ear: Secondary | ICD-10-CM | POA: Diagnosis not present

## 2020-04-12 DIAGNOSIS — Z1211 Encounter for screening for malignant neoplasm of colon: Secondary | ICD-10-CM | POA: Diagnosis not present

## 2020-04-12 DIAGNOSIS — D125 Benign neoplasm of sigmoid colon: Secondary | ICD-10-CM | POA: Diagnosis not present

## 2020-04-12 DIAGNOSIS — K635 Polyp of colon: Secondary | ICD-10-CM | POA: Diagnosis not present

## 2020-05-06 DIAGNOSIS — J0101 Acute recurrent maxillary sinusitis: Secondary | ICD-10-CM | POA: Diagnosis not present

## 2020-05-06 DIAGNOSIS — R05 Cough: Secondary | ICD-10-CM | POA: Diagnosis not present

## 2020-06-08 DIAGNOSIS — J329 Chronic sinusitis, unspecified: Secondary | ICD-10-CM | POA: Diagnosis not present

## 2020-06-08 DIAGNOSIS — H938X3 Other specified disorders of ear, bilateral: Secondary | ICD-10-CM | POA: Diagnosis not present

## 2020-09-20 DIAGNOSIS — F3341 Major depressive disorder, recurrent, in partial remission: Secondary | ICD-10-CM | POA: Diagnosis not present

## 2020-09-20 DIAGNOSIS — Z79899 Other long term (current) drug therapy: Secondary | ICD-10-CM | POA: Diagnosis not present

## 2020-09-20 DIAGNOSIS — R739 Hyperglycemia, unspecified: Secondary | ICD-10-CM | POA: Diagnosis not present

## 2020-10-19 DIAGNOSIS — N6320 Unspecified lump in the left breast, unspecified quadrant: Secondary | ICD-10-CM | POA: Diagnosis not present

## 2020-10-28 DIAGNOSIS — N6320 Unspecified lump in the left breast, unspecified quadrant: Secondary | ICD-10-CM | POA: Diagnosis not present

## 2020-11-30 ENCOUNTER — Other Ambulatory Visit: Payer: Self-pay | Admitting: Obstetrics and Gynecology

## 2020-11-30 DIAGNOSIS — N6320 Unspecified lump in the left breast, unspecified quadrant: Secondary | ICD-10-CM

## 2020-12-02 ENCOUNTER — Other Ambulatory Visit: Payer: Self-pay

## 2020-12-02 ENCOUNTER — Ambulatory Visit
Admission: RE | Admit: 2020-12-02 | Discharge: 2020-12-02 | Disposition: A | Discharge status: Home / Self Care | Payer: BC Managed Care – PPO | Source: Ambulatory Visit | Attending: Obstetrics and Gynecology | Admitting: Obstetrics and Gynecology

## 2020-12-02 DIAGNOSIS — R922 Inconclusive mammogram: Secondary | ICD-10-CM | POA: Diagnosis not present

## 2020-12-02 DIAGNOSIS — N6320 Unspecified lump in the left breast, unspecified quadrant: Secondary | ICD-10-CM

## 2020-12-02 DIAGNOSIS — N6489 Other specified disorders of breast: Secondary | ICD-10-CM | POA: Diagnosis not present

## 2021-03-10 DIAGNOSIS — Z1231 Encounter for screening mammogram for malignant neoplasm of breast: Secondary | ICD-10-CM | POA: Diagnosis not present

## 2021-03-10 DIAGNOSIS — Z01419 Encounter for gynecological examination (general) (routine) without abnormal findings: Secondary | ICD-10-CM | POA: Diagnosis not present

## 2021-03-10 DIAGNOSIS — Z6833 Body mass index (BMI) 33.0-33.9, adult: Secondary | ICD-10-CM | POA: Diagnosis not present

## 2021-03-10 DIAGNOSIS — Z1211 Encounter for screening for malignant neoplasm of colon: Secondary | ICD-10-CM | POA: Diagnosis not present

## 2021-03-21 DIAGNOSIS — R635 Abnormal weight gain: Secondary | ICD-10-CM | POA: Diagnosis not present

## 2021-03-21 DIAGNOSIS — Z0000 Encounter for general adult medical examination without abnormal findings: Secondary | ICD-10-CM | POA: Diagnosis not present

## 2021-03-21 DIAGNOSIS — E785 Hyperlipidemia, unspecified: Secondary | ICD-10-CM | POA: Diagnosis not present

## 2021-09-04 IMAGING — MG DIGITAL DIAGNOSTIC BILAT W/ TOMO W/ CAD
6 of 12 series · 6 of 36 positions shown · non-contrast
Comparison: Previous exam(s).

CLINICAL DATA: 49-year-old female presenting with a new lump in the
left breast for approximately 1 month.

EXAM:
DIGITAL DIAGNOSTIC BILATERAL MAMMOGRAM WITH TOMOSYNTHESIS AND CAD;
ULTRASOUND LEFT BREAST LIMITED
TECHNIQUE: Bilateral digital diagnostic mammography and breast tomosynthesis
was performed. The images were evaluated with computer-aided
detection.; Targeted ultrasound examination of the left breast was
performed

[R MLO synth-2D]
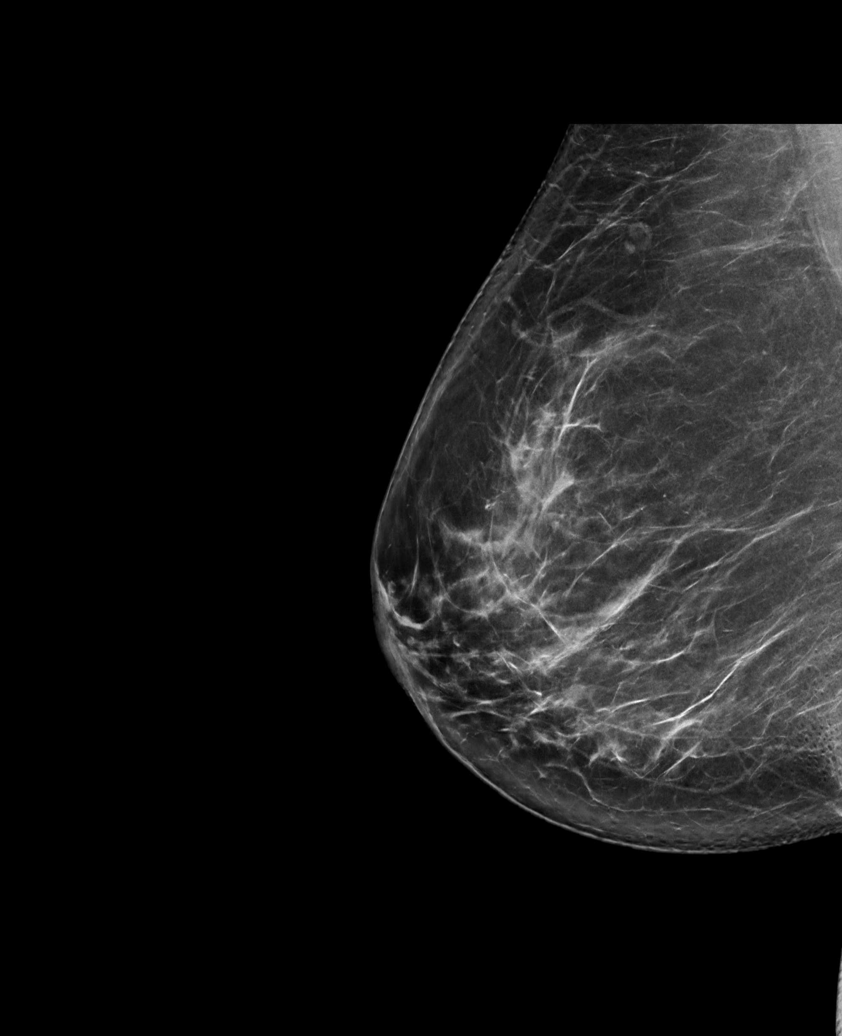

[R CC synth-2D]
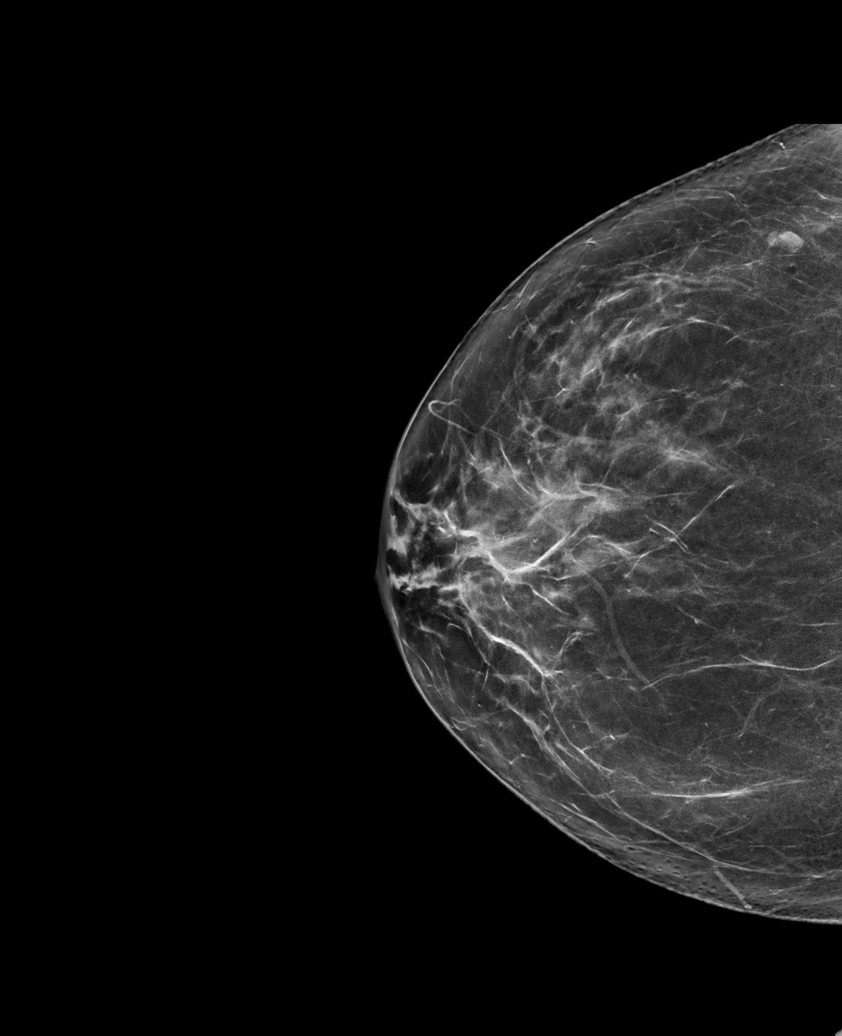

[L MLO synth-2D]
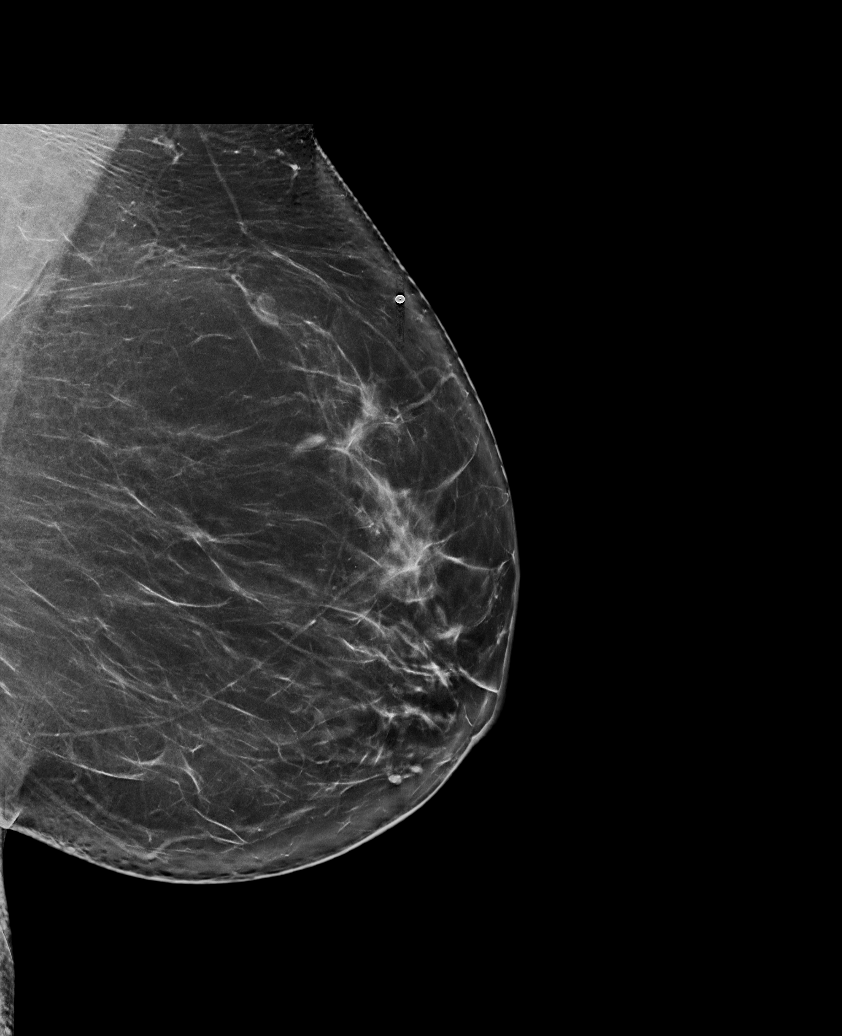

[L CC synth-2D (1 of 2)]
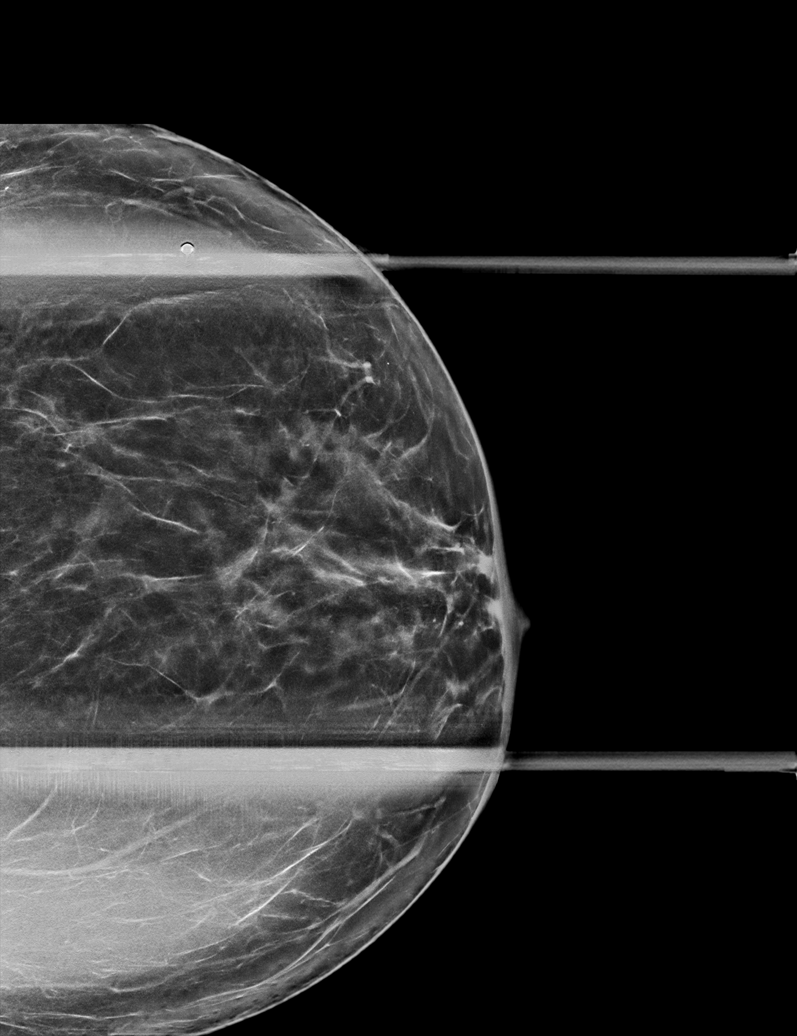

[L CC synth-2D (2 of 2)]
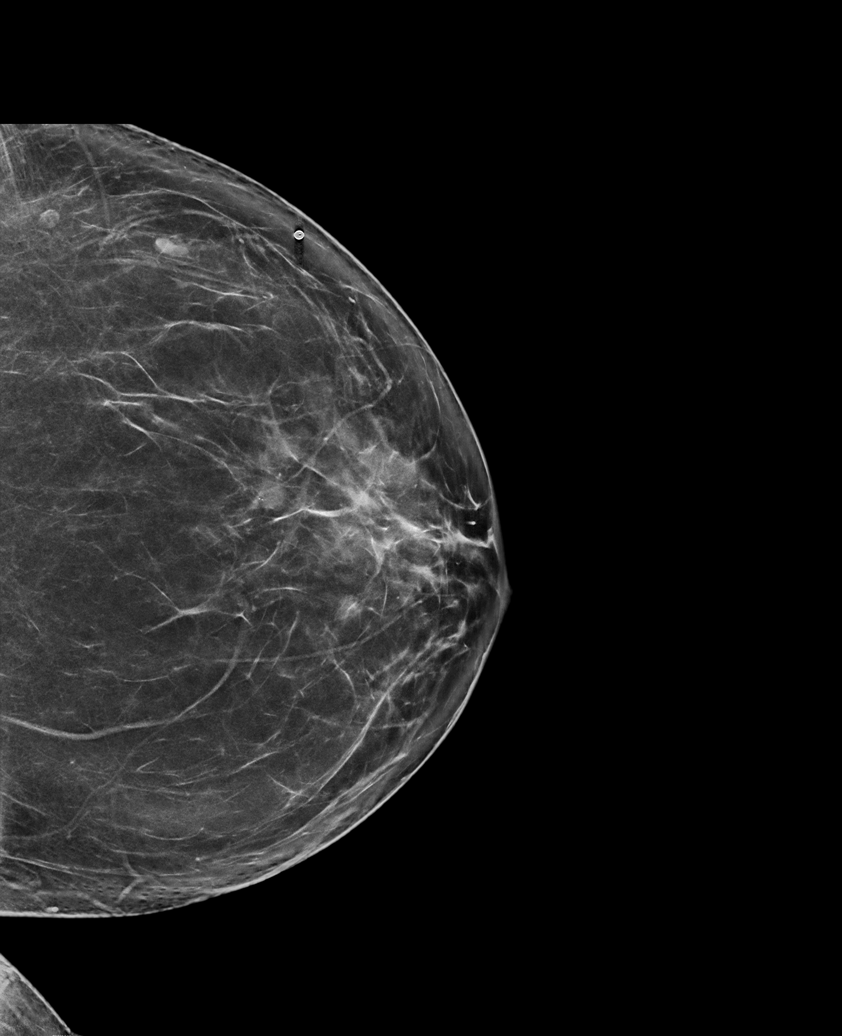

[L TAN synth-2D]
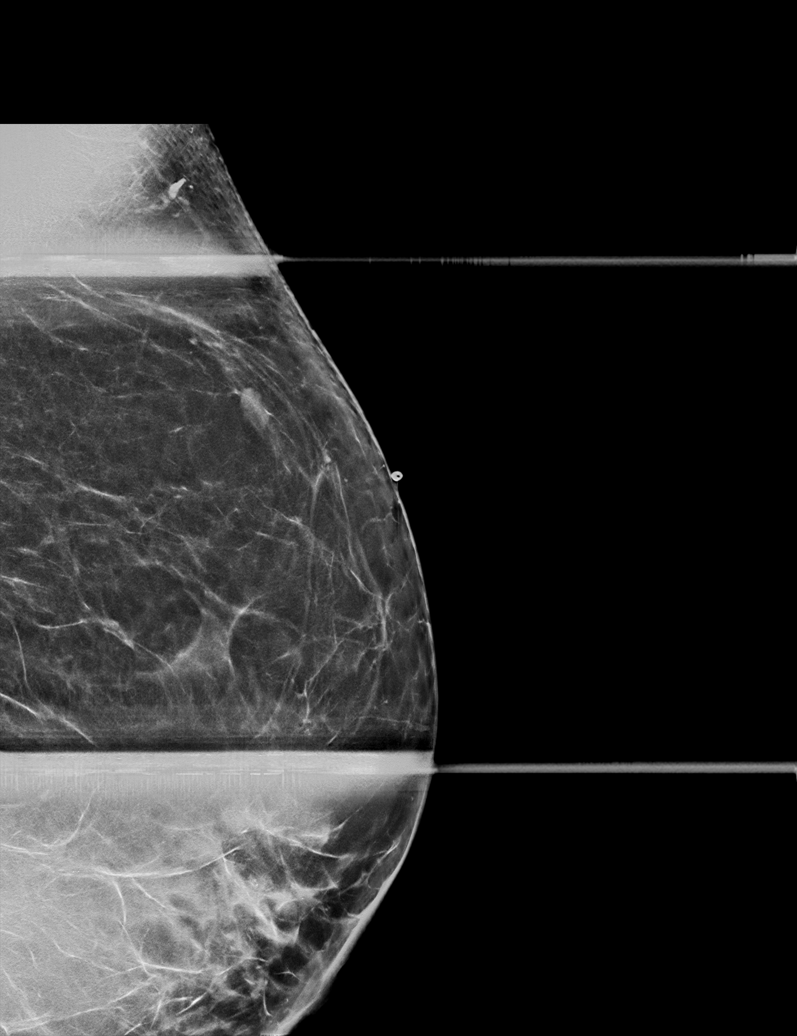

[6 of 36 positions shown; findings below may reference images not displayed]

ACR Breast Density Category c: The breast tissue is heterogeneously
dense, which may obscure small masses.
FINDINGS: Mammogram:

Right breast: No suspicious mass, distortion, or microcalcifications
are identified to suggest presence of malignancy.

Left breast: A skin BB marks the palpable site of concern reported
by the patient in the upper outer left breast. A spot tangential
view of this area was performed in addition to standard views. There
is a stable benign intramammary lymph node near the palpable site.
There is no new abnormality. An additional spot compression
tomosynthesis view of the central breast was performed for a
possible asymmetry which resolves on the additional imaging.

On physical exam at site of concern reported by the patient in the
upper outer left breast I feel mild tissue heterogeneity without a
fixed discrete mass.

Ultrasound:

Targeted ultrasound performed at the palpable site of concern
reported by the patient demonstrating a benign intramammary lymph
node at 2 o'clock 9 cm from nipple. There is no additional
suspicious cystic or solid mass.
IMPRESSION: 1. No mammographic or sonographic evidence of malignancy or other
abnormality at the palpable site of concern reported by the patient
in the left breast.

2.  No mammographic evidence of malignancy in the right breast.

RECOMMENDATION:
1. Recommend any further workup of the palpable site be on a
clinical basis.

2.  Screening mammogram in one year.(Code:[id number])

I have discussed the findings and recommendations with the patient.
If applicable, a reminder letter will be sent to the patient
regarding the next appointment.

BI-RADS CATEGORY  1: Negative.

## 2021-09-16 DIAGNOSIS — U071 COVID-19: Secondary | ICD-10-CM | POA: Diagnosis not present

## 2021-09-16 DIAGNOSIS — F4323 Adjustment disorder with mixed anxiety and depressed mood: Secondary | ICD-10-CM | POA: Diagnosis not present

## 2021-09-21 DIAGNOSIS — F3341 Major depressive disorder, recurrent, in partial remission: Secondary | ICD-10-CM | POA: Diagnosis not present

## 2021-09-21 DIAGNOSIS — G43909 Migraine, unspecified, not intractable, without status migrainosus: Secondary | ICD-10-CM | POA: Diagnosis not present

## 2021-09-22 DIAGNOSIS — F4323 Adjustment disorder with mixed anxiety and depressed mood: Secondary | ICD-10-CM | POA: Diagnosis not present

## 2021-10-04 DIAGNOSIS — F4323 Adjustment disorder with mixed anxiety and depressed mood: Secondary | ICD-10-CM | POA: Diagnosis not present

## 2021-10-19 DIAGNOSIS — F4323 Adjustment disorder with mixed anxiety and depressed mood: Secondary | ICD-10-CM | POA: Diagnosis not present

## 2021-11-09 DIAGNOSIS — F4323 Adjustment disorder with mixed anxiety and depressed mood: Secondary | ICD-10-CM | POA: Diagnosis not present

## 2021-11-28 DIAGNOSIS — F4323 Adjustment disorder with mixed anxiety and depressed mood: Secondary | ICD-10-CM | POA: Diagnosis not present

## 2021-11-30 DIAGNOSIS — F419 Anxiety disorder, unspecified: Secondary | ICD-10-CM | POA: Diagnosis not present

## 2021-11-30 DIAGNOSIS — F330 Major depressive disorder, recurrent, mild: Secondary | ICD-10-CM | POA: Diagnosis not present

## 2021-12-08 DIAGNOSIS — B9689 Other specified bacterial agents as the cause of diseases classified elsewhere: Secondary | ICD-10-CM | POA: Diagnosis not present

## 2021-12-08 DIAGNOSIS — H1033 Unspecified acute conjunctivitis, bilateral: Secondary | ICD-10-CM | POA: Diagnosis not present

## 2021-12-08 DIAGNOSIS — J069 Acute upper respiratory infection, unspecified: Secondary | ICD-10-CM | POA: Diagnosis not present

## 2021-12-27 DIAGNOSIS — F4323 Adjustment disorder with mixed anxiety and depressed mood: Secondary | ICD-10-CM | POA: Diagnosis not present

## 2021-12-28 DIAGNOSIS — F330 Major depressive disorder, recurrent, mild: Secondary | ICD-10-CM | POA: Diagnosis not present

## 2021-12-28 DIAGNOSIS — F419 Anxiety disorder, unspecified: Secondary | ICD-10-CM | POA: Diagnosis not present

## 2022-01-06 DIAGNOSIS — F4323 Adjustment disorder with mixed anxiety and depressed mood: Secondary | ICD-10-CM | POA: Diagnosis not present

## 2022-01-24 DIAGNOSIS — F330 Major depressive disorder, recurrent, mild: Secondary | ICD-10-CM | POA: Diagnosis not present

## 2022-01-24 DIAGNOSIS — F419 Anxiety disorder, unspecified: Secondary | ICD-10-CM | POA: Diagnosis not present

## 2022-01-25 DIAGNOSIS — F4323 Adjustment disorder with mixed anxiety and depressed mood: Secondary | ICD-10-CM | POA: Diagnosis not present

## 2022-02-07 DIAGNOSIS — F4323 Adjustment disorder with mixed anxiety and depressed mood: Secondary | ICD-10-CM | POA: Diagnosis not present

## 2022-02-20 DIAGNOSIS — F4323 Adjustment disorder with mixed anxiety and depressed mood: Secondary | ICD-10-CM | POA: Diagnosis not present

## 2022-03-14 DIAGNOSIS — F419 Anxiety disorder, unspecified: Secondary | ICD-10-CM | POA: Diagnosis not present

## 2022-03-14 DIAGNOSIS — F330 Major depressive disorder, recurrent, mild: Secondary | ICD-10-CM | POA: Diagnosis not present

## 2022-03-20 DIAGNOSIS — F4323 Adjustment disorder with mixed anxiety and depressed mood: Secondary | ICD-10-CM | POA: Diagnosis not present

## 2022-04-04 DIAGNOSIS — Z1231 Encounter for screening mammogram for malignant neoplasm of breast: Secondary | ICD-10-CM | POA: Diagnosis not present

## 2022-04-04 DIAGNOSIS — Z01419 Encounter for gynecological examination (general) (routine) without abnormal findings: Secondary | ICD-10-CM | POA: Diagnosis not present

## 2022-05-23 DIAGNOSIS — F4323 Adjustment disorder with mixed anxiety and depressed mood: Secondary | ICD-10-CM | POA: Diagnosis not present

## 2022-06-06 DIAGNOSIS — F419 Anxiety disorder, unspecified: Secondary | ICD-10-CM | POA: Diagnosis not present

## 2022-06-06 DIAGNOSIS — F330 Major depressive disorder, recurrent, mild: Secondary | ICD-10-CM | POA: Diagnosis not present

## 2022-07-07 DIAGNOSIS — F419 Anxiety disorder, unspecified: Secondary | ICD-10-CM | POA: Diagnosis not present

## 2022-07-07 DIAGNOSIS — F902 Attention-deficit hyperactivity disorder, combined type: Secondary | ICD-10-CM | POA: Diagnosis not present

## 2022-07-07 DIAGNOSIS — F339 Major depressive disorder, recurrent, unspecified: Secondary | ICD-10-CM | POA: Diagnosis not present

## 2022-07-28 DIAGNOSIS — F419 Anxiety disorder, unspecified: Secondary | ICD-10-CM | POA: Diagnosis not present

## 2022-07-28 DIAGNOSIS — F339 Major depressive disorder, recurrent, unspecified: Secondary | ICD-10-CM | POA: Diagnosis not present

## 2022-08-10 DIAGNOSIS — E559 Vitamin D deficiency, unspecified: Secondary | ICD-10-CM | POA: Diagnosis not present

## 2022-08-24 DIAGNOSIS — F339 Major depressive disorder, recurrent, unspecified: Secondary | ICD-10-CM | POA: Diagnosis not present

## 2022-08-24 DIAGNOSIS — F419 Anxiety disorder, unspecified: Secondary | ICD-10-CM | POA: Diagnosis not present

## 2022-09-20 DIAGNOSIS — E559 Vitamin D deficiency, unspecified: Secondary | ICD-10-CM | POA: Diagnosis not present

## 2022-09-20 DIAGNOSIS — R899 Unspecified abnormal finding in specimens from other organs, systems and tissues: Secondary | ICD-10-CM | POA: Diagnosis not present

## 2022-09-20 DIAGNOSIS — E2839 Other primary ovarian failure: Secondary | ICD-10-CM | POA: Diagnosis not present

## 2022-09-21 DIAGNOSIS — F419 Anxiety disorder, unspecified: Secondary | ICD-10-CM | POA: Diagnosis not present

## 2022-09-21 DIAGNOSIS — F339 Major depressive disorder, recurrent, unspecified: Secondary | ICD-10-CM | POA: Diagnosis not present

## 2022-10-19 DIAGNOSIS — F419 Anxiety disorder, unspecified: Secondary | ICD-10-CM | POA: Diagnosis not present

## 2022-10-19 DIAGNOSIS — F339 Major depressive disorder, recurrent, unspecified: Secondary | ICD-10-CM | POA: Diagnosis not present

## 2022-11-14 DIAGNOSIS — F419 Anxiety disorder, unspecified: Secondary | ICD-10-CM | POA: Diagnosis not present

## 2022-11-14 DIAGNOSIS — F339 Major depressive disorder, recurrent, unspecified: Secondary | ICD-10-CM | POA: Diagnosis not present

## 2022-12-22 DIAGNOSIS — F419 Anxiety disorder, unspecified: Secondary | ICD-10-CM | POA: Diagnosis not present

## 2022-12-22 DIAGNOSIS — F339 Major depressive disorder, recurrent, unspecified: Secondary | ICD-10-CM | POA: Diagnosis not present

## 2023-01-31 DIAGNOSIS — F339 Major depressive disorder, recurrent, unspecified: Secondary | ICD-10-CM | POA: Diagnosis not present

## 2023-01-31 DIAGNOSIS — F419 Anxiety disorder, unspecified: Secondary | ICD-10-CM | POA: Diagnosis not present

## 2023-04-09 DIAGNOSIS — H00014 Hordeolum externum left upper eyelid: Secondary | ICD-10-CM | POA: Diagnosis not present

## 2023-04-25 DIAGNOSIS — F419 Anxiety disorder, unspecified: Secondary | ICD-10-CM | POA: Diagnosis not present

## 2023-04-25 DIAGNOSIS — F339 Major depressive disorder, recurrent, unspecified: Secondary | ICD-10-CM | POA: Diagnosis not present

## 2023-05-01 DIAGNOSIS — Z124 Encounter for screening for malignant neoplasm of cervix: Secondary | ICD-10-CM | POA: Diagnosis not present

## 2023-05-01 DIAGNOSIS — Z139 Encounter for screening, unspecified: Secondary | ICD-10-CM | POA: Diagnosis not present

## 2023-05-01 DIAGNOSIS — Z1231 Encounter for screening mammogram for malignant neoplasm of breast: Secondary | ICD-10-CM | POA: Diagnosis not present

## 2023-05-01 DIAGNOSIS — Z01419 Encounter for gynecological examination (general) (routine) without abnormal findings: Secondary | ICD-10-CM | POA: Diagnosis not present

## 2023-06-13 DIAGNOSIS — H16201 Unspecified keratoconjunctivitis, right eye: Secondary | ICD-10-CM | POA: Diagnosis not present

## 2023-06-13 DIAGNOSIS — H16041 Marginal corneal ulcer, right eye: Secondary | ICD-10-CM | POA: Diagnosis not present

## 2023-06-20 DIAGNOSIS — H16201 Unspecified keratoconjunctivitis, right eye: Secondary | ICD-10-CM | POA: Diagnosis not present

## 2023-07-24 DIAGNOSIS — F419 Anxiety disorder, unspecified: Secondary | ICD-10-CM | POA: Diagnosis not present

## 2023-07-24 DIAGNOSIS — F339 Major depressive disorder, recurrent, unspecified: Secondary | ICD-10-CM | POA: Diagnosis not present

## 2023-10-16 DIAGNOSIS — F419 Anxiety disorder, unspecified: Secondary | ICD-10-CM | POA: Diagnosis not present

## 2023-10-16 DIAGNOSIS — F339 Major depressive disorder, recurrent, unspecified: Secondary | ICD-10-CM | POA: Diagnosis not present

## 2024-01-02 DIAGNOSIS — F339 Major depressive disorder, recurrent, unspecified: Secondary | ICD-10-CM | POA: Diagnosis not present

## 2024-01-02 DIAGNOSIS — F419 Anxiety disorder, unspecified: Secondary | ICD-10-CM | POA: Diagnosis not present

## 2024-02-21 ENCOUNTER — Other Ambulatory Visit: Payer: Self-pay | Admitting: Obstetrics and Gynecology

## 2024-02-21 DIAGNOSIS — Z1231 Encounter for screening mammogram for malignant neoplasm of breast: Secondary | ICD-10-CM

## 2024-03-25 DIAGNOSIS — F339 Major depressive disorder, recurrent, unspecified: Secondary | ICD-10-CM | POA: Diagnosis not present

## 2024-03-25 DIAGNOSIS — F419 Anxiety disorder, unspecified: Secondary | ICD-10-CM | POA: Diagnosis not present

## 2024-05-05 ENCOUNTER — Ambulatory Visit
Admission: RE | Admit: 2024-05-05 | Discharge: 2024-05-05 | Disposition: A | Discharge status: Home / Self Care | Source: Ambulatory Visit | Attending: Obstetrics and Gynecology | Admitting: Obstetrics and Gynecology

## 2024-05-05 DIAGNOSIS — Z1231 Encounter for screening mammogram for malignant neoplasm of breast: Secondary | ICD-10-CM | POA: Diagnosis not present

## 2024-05-05 DIAGNOSIS — Z01419 Encounter for gynecological examination (general) (routine) without abnormal findings: Secondary | ICD-10-CM | POA: Diagnosis not present

## 2024-05-27 DIAGNOSIS — F419 Anxiety disorder, unspecified: Secondary | ICD-10-CM | POA: Diagnosis not present

## 2024-05-27 DIAGNOSIS — F339 Major depressive disorder, recurrent, unspecified: Secondary | ICD-10-CM | POA: Diagnosis not present

## 2024-07-01 DIAGNOSIS — Z713 Dietary counseling and surveillance: Secondary | ICD-10-CM | POA: Diagnosis not present

## 2024-07-09 DIAGNOSIS — Z713 Dietary counseling and surveillance: Secondary | ICD-10-CM | POA: Diagnosis not present

## 2024-07-21 DIAGNOSIS — Z713 Dietary counseling and surveillance: Secondary | ICD-10-CM | POA: Diagnosis not present

## 2024-07-29 DIAGNOSIS — Z713 Dietary counseling and surveillance: Secondary | ICD-10-CM | POA: Diagnosis not present

## 2024-08-07 DIAGNOSIS — Z713 Dietary counseling and surveillance: Secondary | ICD-10-CM | POA: Diagnosis not present

## 2024-08-11 DIAGNOSIS — M2550 Pain in unspecified joint: Secondary | ICD-10-CM | POA: Diagnosis not present

## 2024-08-11 DIAGNOSIS — F330 Major depressive disorder, recurrent, mild: Secondary | ICD-10-CM | POA: Diagnosis not present

## 2024-08-11 DIAGNOSIS — M25561 Pain in right knee: Secondary | ICD-10-CM | POA: Diagnosis not present
